# Patient Record
Sex: Female | Born: 1995 | Hispanic: Yes | Marital: Single | Smoking: Never smoker
Health system: Southern US, Community
[De-identification: ages and names within clinical notes are randomized; demographics above are authoritative.]

## PROBLEM LIST (undated history)

## (undated) ENCOUNTER — Inpatient Hospital Stay: Payer: Self-pay

## (undated) DIAGNOSIS — Z862 Personal history of diseases of the blood and blood-forming organs and certain disorders involving the immune mechanism: Secondary | ICD-10-CM

## (undated) DIAGNOSIS — D649 Anemia, unspecified: Secondary | ICD-10-CM

## (undated) HISTORY — DX: Personal history of diseases of the blood and blood-forming organs and certain disorders involving the immune mechanism: Z86.2

---

## 2015-06-20 NOTE — L&D Delivery Note (Signed)
Delivery Note At  a viable and healthy female "Raven Barker" was delivered via  (Presentation:OA ;  ).  APGAR: 8, 9; weight  .   Placenta status: delivered intact with 3 vessel  Cord:  with the following complications: NCx1-reduced on perineum.   Anesthesia:  none Episiotomy:  none Lacerations:  2nd degree Suture Repair: 3.0 vicryl rapide Est. Blood Loss (mL):  300  Mom to postpartum.  Baby to Couplet care / Skin to Skin.  [name], CNM 01/29/2016, 4:36 PM

## 2015-07-08 ENCOUNTER — Ambulatory Visit (INDEPENDENT_AMBULATORY_CARE_PROVIDER_SITE_OTHER): Payer: Self-pay | Admitting: Obstetrics and Gynecology

## 2015-07-08 ENCOUNTER — Ambulatory Visit (INDEPENDENT_AMBULATORY_CARE_PROVIDER_SITE_OTHER): Payer: Self-pay

## 2015-07-08 VITALS — BP 135/76 | HR 116 | Ht 66.0 in | Wt 140.1 lb

## 2015-07-08 DIAGNOSIS — Z36 Encounter for antenatal screening of mother: Secondary | ICD-10-CM

## 2015-07-08 DIAGNOSIS — Z3682 Encounter for antenatal screening for nuchal translucency: Secondary | ICD-10-CM

## 2015-07-08 DIAGNOSIS — Z113 Encounter for screening for infections with a predominantly sexual mode of transmission: Secondary | ICD-10-CM

## 2015-07-08 DIAGNOSIS — Z369 Encounter for antenatal screening, unspecified: Secondary | ICD-10-CM

## 2015-07-08 DIAGNOSIS — Z3490 Encounter for supervision of normal pregnancy, unspecified, unspecified trimester: Secondary | ICD-10-CM

## 2015-07-08 DIAGNOSIS — Z331 Pregnant state, incidental: Secondary | ICD-10-CM

## 2015-07-08 DIAGNOSIS — Z3687 Encounter for antenatal screening for uncertain dates: Secondary | ICD-10-CM

## 2015-07-08 DIAGNOSIS — O283 Abnormal ultrasonic finding on antenatal screening of mother: Secondary | ICD-10-CM

## 2015-07-08 DIAGNOSIS — Z1389 Encounter for screening for other disorder: Secondary | ICD-10-CM

## 2015-07-08 NOTE — Progress Notes (Signed)
   Raven Barker presents for NOB nurse interview visit. G-1.  P-0. Pregnancy confirmation done at [facility] on 06/24/2015. LMP: 04/22/2015. Pregnancy education material explained and given. No cats in the home. NOB labs ordered.  HIV labs and Drug screen were explained optional and she could opt out of tests but did not decline. Drug screen ordered. No n/v at present. Not a problem at present. PNV encouraged. NT to discuss with provider.  Ultrasound today for viability and dating.  Pt. To follow up with provider in 2 weeks for NOB physical and NT ultrasound.  Fob (maternal aunt) had child (cousin) with Down's Syndrome and at the time of pregnancy she was AMA.  All questions answered.  ZIKA EXPOSURE SCREEN:  The patient has not traveled to a [place] Virus endemic area within the past 6 months, nor has she had unprotected sex with a partner who has travelled to a [place] endemic region within the past 6 months. The patient has been advised to notify us if these factors change any time during this current pregnancy, so adequate testing and monitoring can be initiated.

## 2015-07-08 NOTE — Patient Instructions (Signed)

## 2015-07-09 LAB — PAIN MGT SCRN (14 DRUGS), UR
Amphetamine Screen, Ur: NEGATIVE ng/mL
BARBITURATE SCRN UR: NEGATIVE ng/mL
BENZODIAZEPINE SCREEN, URINE: NEGATIVE ng/mL
Buprenorphine, Urine: NEGATIVE ng/mL
CREATININE(CRT), U: 243.7 mg/dL (ref 20.0–300.0)
Cannabinoids Ur Ql Scn: NEGATIVE ng/mL
Cocaine(Metab.)Screen, Urine: NEGATIVE ng/mL
Fentanyl, Urine: NEGATIVE pg/mL
MEPERIDINE SCREEN, URINE: NEGATIVE ng/mL
METHADONE SCREEN, URINE: NEGATIVE ng/mL
OPIATE SCRN UR: NEGATIVE ng/mL
Oxycodone+Oxymorphone Ur Ql Scn: NEGATIVE ng/mL
PCP Scrn, Ur: NEGATIVE ng/mL
Ph of Urine: 6.3 (ref 4.5–8.9)
Propoxyphene, Screen: NEGATIVE ng/mL
Tramadol Ur Ql Scn: NEGATIVE ng/mL

## 2015-07-09 LAB — URINALYSIS, ROUTINE W REFLEX MICROSCOPIC
BILIRUBIN UA: NEGATIVE
GLUCOSE, UA: NEGATIVE
KETONES UA: NEGATIVE
LEUKOCYTES UA: NEGATIVE
Nitrite, UA: NEGATIVE
PROTEIN UA: NEGATIVE
RBC, UA: NEGATIVE
SPEC GRAV UA: 1.014 (ref 1.005–1.030)
Urobilinogen, Ur: 0.2 mg/dL (ref 0.2–1.0)
pH, UA: 7.5 (ref 5.0–7.5)

## 2015-07-09 LAB — NICOTINE SCREEN, URINE: COTININE UR QL SCN: NEGATIVE ng/mL

## 2015-07-09 LAB — GC/CHLAMYDIA PROBE AMP
Chlamydia trachomatis, NAA: NEGATIVE
NEISSERIA GONORRHOEAE BY PCR: NEGATIVE

## 2015-07-10 LAB — CULTURE, OB URINE

## 2015-07-10 LAB — URINE CULTURE, OB REFLEX

## 2015-07-10 LAB — SPECIMEN STATUS REPORT

## 2015-07-12 ENCOUNTER — Other Ambulatory Visit: Payer: Self-pay | Admitting: Obstetrics and Gynecology

## 2015-07-12 DIAGNOSIS — Z283 Underimmunization status: Secondary | ICD-10-CM

## 2015-07-12 DIAGNOSIS — Z2839 Other underimmunization status: Secondary | ICD-10-CM | POA: Insufficient documentation

## 2015-07-12 DIAGNOSIS — O9989 Other specified diseases and conditions complicating pregnancy, childbirth and the puerperium: Principal | ICD-10-CM

## 2015-07-12 LAB — RUBELLA ANTIBODY, IGM

## 2015-07-12 LAB — HEPATITIS B SURFACE ANTIGEN: HEP B S AG: NEGATIVE

## 2015-07-12 LAB — ABO

## 2015-07-12 LAB — HIV ANTIBODY (ROUTINE TESTING W REFLEX): HIV SCREEN 4TH GENERATION: NONREACTIVE

## 2015-07-12 LAB — ANTIBODY SCREEN: ANTIBODY SCREEN: NEGATIVE

## 2015-07-12 LAB — RH TYPE

## 2015-07-12 LAB — CBC WITH DIFFERENTIAL/PLATELET

## 2015-07-12 LAB — RPR: RPR: NONREACTIVE

## 2015-07-12 LAB — VARICELLA ZOSTER ANTIBODY, IGM: VARICELLA IGM: 1.37 {index} — AB (ref 0.00–0.90)

## 2015-07-21 ENCOUNTER — Other Ambulatory Visit: Payer: Self-pay | Admitting: Obstetrics and Gynecology

## 2015-07-21 ENCOUNTER — Encounter: Payer: Self-pay | Admitting: Obstetrics and Gynecology

## 2015-07-21 ENCOUNTER — Ambulatory Visit (INDEPENDENT_AMBULATORY_CARE_PROVIDER_SITE_OTHER): Payer: Medicaid Other

## 2015-07-21 ENCOUNTER — Ambulatory Visit (INDEPENDENT_AMBULATORY_CARE_PROVIDER_SITE_OTHER): Payer: Medicaid Other | Admitting: Obstetrics and Gynecology

## 2015-07-21 VITALS — BP 91/50 | HR 88 | Wt 138.1 lb

## 2015-07-21 DIAGNOSIS — O09899 Supervision of other high risk pregnancies, unspecified trimester: Secondary | ICD-10-CM

## 2015-07-21 DIAGNOSIS — O9989 Other specified diseases and conditions complicating pregnancy, childbirth and the puerperium: Secondary | ICD-10-CM

## 2015-07-21 DIAGNOSIS — Z283 Underimmunization status: Secondary | ICD-10-CM

## 2015-07-21 DIAGNOSIS — Z36 Encounter for antenatal screening of mother: Secondary | ICD-10-CM | POA: Diagnosis not present

## 2015-07-21 DIAGNOSIS — O283 Abnormal ultrasonic finding on antenatal screening of mother: Secondary | ICD-10-CM | POA: Diagnosis not present

## 2015-07-21 DIAGNOSIS — Z331 Pregnant state, incidental: Secondary | ICD-10-CM

## 2015-07-21 LAB — POCT URINALYSIS DIPSTICK
Blood, UA: NEGATIVE
Glucose, UA: NEGATIVE
KETONES UA: 5
Nitrite, UA: POSITIVE
SPEC GRAV UA: 1.02
Urobilinogen, UA: 0.2
pH, UA: 6.5

## 2015-07-21 NOTE — Progress Notes (Signed)
NOB- pt is very nauseated-samples given diclegis

## 2015-07-21 NOTE — Patient Instructions (Signed)

## 2015-07-21 NOTE — Progress Notes (Signed)
NEW OB HISTORY AND PHYSICAL  SUBJECTIVE:       Raven Barker is a 20 y.o. G1P0 female, Patient's last menstrual period was 04/22/2015 (approximate)., Estimated Date of Delivery: 01/27/16, [redacted]w[redacted]d, presents today for establishment of Prenatal Care. She has no unusual complaints and complains of backache      Gynecologic History Patient's last menstrual period was 04/22/2015 (approximate). Unknown Contraception: none  Obstetric History OB History  Gravida Para Term Preterm AB SAB TAB Ectopic Multiple Living  1             # Outcome Date GA Lbr Len/2nd Weight Sex Delivery Anes PTL Lv  1 Current               Past Medical History  Diagnosis Date  . History of anemia     History reviewed. No pertinent past surgical history.  Current Outpatient Prescriptions on File Prior to Visit  Medication Sig Dispense Refill  . Prenatal Vit-Fe Fumarate-FA (PRENATAL MULTIVITAMIN) TABS tablet Take 1 tablet by mouth daily at 12 noon.     No current facility-administered medications on file prior to visit.    No Known Allergies  Social History   Social History  . Marital Status: Single    Spouse Name: N/A  . Number of Children: N/A  . Years of Education: N/A   Occupational History  . Not on file.   Social History Main Topics  . Smoking status: Never Smoker   . Smokeless tobacco: Never Used  . Alcohol Use: No  . Drug Use: No  . Sexual Activity:    Partners: Male   Other Topics Concern  . Not on file   Social History Narrative    Family History  Problem Relation Age of Onset  . Diabetes Paternal Grandmother     The following portions of the patient's history were reviewed and updated as appropriate: allergies, current medications, past OB history, past medical history, past surgical history, past family history, past social history, and problem list.    OBJECTIVE: Initial Physical Exam (New OB)  GENERAL APPEARANCE: alert, well appearing, in no apparent distress,  oriented to person, place and time HEAD: normocephalic, atraumatic MOUTH: mucous membranes moist, pharynx normal without lesions THYROID: no thyromegaly or masses present BREASTS: no masses noted, no significant tenderness, no palpable axillary nodes, no skin changes LUNGS: clear to auscultation, no wheezes, rales or rhonchi, symmetric air entry HEART: regular rate and rhythm, no murmurs ABDOMEN: soft, nontender, nondistended, no abnormal masses, no epigastric pain, fundus not palpable and FHT present EXTREMITIES: no redness or tenderness in the calves or thighs SKIN: normal coloration and turgor, no rashes LYMPH NODES: no adenopathy palpable NEUROLOGIC: alert, oriented, normal speech, no focal findings or movement disorder noted  PELVIC EXAM not indicated  ASSESSMENT: Normal pregnancy  PLAN: Prenatal care See ordersIndications:First Trimester Screen - NT Findings:  Singleton intrauterine pregnancy is visualized with a CRL consistent with 12 weeks 4 days gestation, giving an (U/S) EDD of 01/29/16. The (U/S) EDD is consistent with the clinically established (LMP) EDD of 01/27/16.  FHR: 158 bpm. CRL measurement: 60.4 mm NT measurement: 1.5 mm. Yolk sac and and early anatomy is normal.  Right Ovary is not visualized. Left Ovary measures 3.0 x 2.5 x 2.1 cm. It appears normal in appearance. There is no evidence of a corpus luteal cyst. Survey of the adnexa demonstrates no adnexal masses. There is no free peritoneal fluid in the cul de sac.  Impression: 1. 12 week  4 day Viable Singleton Intrauterine pregnancy by U/S. 2. (U/S) EDD is consistent with Clinically established (LMP) EDD of 01/27/16. 3. NT Screen successfully completed.

## 2015-07-22 LAB — SPECIMEN STATUS

## 2015-07-23 LAB — FIRST TRIMESTER SCREEN W/NT
CRL: 60.4 mm
DIA MOM: 0.94
DIA Value: 241.3 pg/mL
Gest Age-Collect: 12.4 weeks
Maternal Age At EDD: 21.5 years
Nuchal Translucency MoM: 1.26
Nuchal Translucency: 1.5 mm
Number of Fetuses: 1
PAPP-A MOM: 0.89
PAPP-A VALUE: 931.7 ng/mL
PDF: 0
Test Results:: NEGATIVE
Weight: 138 [lb_av]
hCG MoM: 0.77
hCG Value: 76.9 IU/mL

## 2015-07-23 LAB — CBC
Hematocrit: 36.9 % (ref 34.0–46.6)
Hemoglobin: 12.6 g/dL (ref 11.1–15.9)
MCH: 30.1 pg (ref 26.6–33.0)
MCHC: 34.1 g/dL (ref 31.5–35.7)
MCV: 88 fL (ref 79–97)
Platelets: 292 10*3/uL (ref 150–379)
RBC: 4.19 x10E6/uL (ref 3.77–5.28)
RDW: 13.4 % (ref 12.3–15.4)
WBC: 7 10*3/uL (ref 3.4–10.8)

## 2015-07-23 LAB — SPECIMEN STATUS REPORT

## 2015-07-23 LAB — ABO/RH: Rh Factor: POSITIVE

## 2015-08-20 ENCOUNTER — Encounter: Payer: Medicaid Other | Admitting: Obstetrics and Gynecology

## 2015-09-07 ENCOUNTER — Ambulatory Visit (INDEPENDENT_AMBULATORY_CARE_PROVIDER_SITE_OTHER): Payer: Medicaid Other | Admitting: Obstetrics and Gynecology

## 2015-09-07 ENCOUNTER — Encounter: Payer: Self-pay | Admitting: Obstetrics and Gynecology

## 2015-09-07 VITALS — BP 112/62 | HR 99 | Wt 141.0 lb

## 2015-09-07 DIAGNOSIS — Z331 Pregnant state, incidental: Secondary | ICD-10-CM

## 2015-09-07 LAB — POCT URINALYSIS DIPSTICK
Bilirubin, UA: NEGATIVE
Glucose, UA: NEGATIVE
Ketones, UA: NEGATIVE
Leukocytes, UA: NEGATIVE
NITRITE UA: NEGATIVE
PROTEIN UA: NEGATIVE
RBC UA: NEGATIVE
SPEC GRAV UA: 1.01
UROBILINOGEN UA: 0.2
pH, UA: 7

## 2015-09-07 NOTE — Progress Notes (Signed)
ROB-pt denies any complaints 

## 2015-09-15 ENCOUNTER — Ambulatory Visit (INDEPENDENT_AMBULATORY_CARE_PROVIDER_SITE_OTHER): Payer: Medicaid Other

## 2015-09-15 DIAGNOSIS — Z331 Pregnant state, incidental: Secondary | ICD-10-CM

## 2015-09-24 ENCOUNTER — Other Ambulatory Visit: Payer: Self-pay | Admitting: Obstetrics and Gynecology

## 2015-09-24 DIAGNOSIS — Z0489 Encounter for examination and observation for other specified reasons: Secondary | ICD-10-CM

## 2015-09-24 DIAGNOSIS — IMO0002 Reserved for concepts with insufficient information to code with codable children: Secondary | ICD-10-CM

## 2015-09-30 ENCOUNTER — Ambulatory Visit (INDEPENDENT_AMBULATORY_CARE_PROVIDER_SITE_OTHER): Payer: Medicaid Other

## 2015-09-30 DIAGNOSIS — Z36 Encounter for antenatal screening of mother: Secondary | ICD-10-CM | POA: Diagnosis not present

## 2015-09-30 DIAGNOSIS — IMO0002 Reserved for concepts with insufficient information to code with codable children: Secondary | ICD-10-CM

## 2015-09-30 DIAGNOSIS — Z0489 Encounter for examination and observation for other specified reasons: Secondary | ICD-10-CM

## 2015-10-08 ENCOUNTER — Ambulatory Visit (INDEPENDENT_AMBULATORY_CARE_PROVIDER_SITE_OTHER): Payer: Medicaid Other | Admitting: Obstetrics and Gynecology

## 2015-10-08 ENCOUNTER — Encounter: Payer: Self-pay | Admitting: Obstetrics and Gynecology

## 2015-10-08 VITALS — BP 103/67 | HR 106 | Wt 152.3 lb

## 2015-10-08 DIAGNOSIS — Z331 Pregnant state, incidental: Secondary | ICD-10-CM

## 2015-10-08 LAB — POCT URINALYSIS DIPSTICK
Bilirubin, UA: NEGATIVE
GLUCOSE UA: NEGATIVE
KETONES UA: NEGATIVE
Leukocytes, UA: NEGATIVE
Nitrite, UA: NEGATIVE
Protein, UA: NEGATIVE
RBC UA: NEGATIVE
SPEC GRAV UA: 1.015
UROBILINOGEN UA: 0.2
pH, UA: 7

## 2015-10-08 NOTE — Progress Notes (Signed)
ROB- doing well w/o complaint; reviewed u/s findings from last week and counseled on LLP (2.2cm from os). Will rescan in 4 weeks at glucola visit. Gave schedule for classes and encouraged enrollment. Plans breast feeding.

## 2015-10-08 NOTE — Progress Notes (Signed)
ROB-pt denies any complaints 

## 2015-11-01 ENCOUNTER — Other Ambulatory Visit: Payer: Self-pay | Admitting: *Deleted

## 2015-11-01 DIAGNOSIS — Z131 Encounter for screening for diabetes mellitus: Secondary | ICD-10-CM

## 2015-11-01 DIAGNOSIS — Z3493 Encounter for supervision of normal pregnancy, unspecified, third trimester: Secondary | ICD-10-CM

## 2015-11-05 ENCOUNTER — Ambulatory Visit (INDEPENDENT_AMBULATORY_CARE_PROVIDER_SITE_OTHER): Payer: Medicaid Other | Admitting: Obstetrics and Gynecology

## 2015-11-05 ENCOUNTER — Other Ambulatory Visit: Payer: Medicaid Other

## 2015-11-05 ENCOUNTER — Ambulatory Visit (INDEPENDENT_AMBULATORY_CARE_PROVIDER_SITE_OTHER): Payer: Medicaid Other

## 2015-11-05 ENCOUNTER — Other Ambulatory Visit: Payer: Self-pay | Admitting: Obstetrics and Gynecology

## 2015-11-05 ENCOUNTER — Encounter: Payer: Self-pay | Admitting: Obstetrics and Gynecology

## 2015-11-05 VITALS — BP 115/63 | HR 85 | Wt 160.0 lb

## 2015-11-05 DIAGNOSIS — Z3403 Encounter for supervision of normal first pregnancy, third trimester: Secondary | ICD-10-CM

## 2015-11-05 DIAGNOSIS — Z3493 Encounter for supervision of normal pregnancy, unspecified, third trimester: Secondary | ICD-10-CM

## 2015-11-05 DIAGNOSIS — Z331 Pregnant state, incidental: Secondary | ICD-10-CM

## 2015-11-05 DIAGNOSIS — Z23 Encounter for immunization: Secondary | ICD-10-CM

## 2015-11-05 LAB — POCT URINALYSIS DIPSTICK
BILIRUBIN UA: NEGATIVE
Glucose, UA: NEGATIVE
KETONES UA: NEGATIVE
Leukocytes, UA: NEGATIVE
Nitrite, UA: NEGATIVE
PH UA: 6.5
Protein, UA: NEGATIVE
RBC UA: NEGATIVE
Spec Grav, UA: 1.015
Urobilinogen, UA: 0.2

## 2015-11-05 MED ORDER — TETANUS-DIPHTH-ACELL PERTUSSIS 5-2.5-18.5 LF-MCG/0.5 IM SUSP: 0.5000 mL | Freq: Once | INTRAMUSCULAR | Status: DC

## 2015-11-05 NOTE — Progress Notes (Signed)
ROB- glucola done, blood consent signed, tdap given Pt denies any new complaints

## 2015-11-05 NOTE — Progress Notes (Signed)
  Indications:placenta location Findings:  Raven JimSingleton intrauterine pregnancy is visualized with FHR at 145 BPM.  Fetal presentation is Vertex. Placenta: posterior, grade1, 3cm from internal os. AFI: subjectively adequate.  Impression: 1. Placenta is 3 cm from internal os  ROB- denies concerns, has completed classes, discussed cord blood donation

## 2015-11-06 LAB — GLUCOSE, 1 HOUR GESTATIONAL: Gestational Diabetes Screen: 103 mg/dL (ref 65–139)

## 2015-11-06 LAB — HEMOGLOBIN: HEMOGLOBIN: 10.5 g/dL — AB (ref 11.1–15.9)

## 2015-11-06 LAB — HEMATOCRIT: HEMATOCRIT: 31.1 % — AB (ref 34.0–46.6)

## 2015-11-23 ENCOUNTER — Encounter: Payer: Self-pay | Admitting: Obstetrics and Gynecology

## 2015-11-23 ENCOUNTER — Ambulatory Visit (INDEPENDENT_AMBULATORY_CARE_PROVIDER_SITE_OTHER): Payer: Medicaid Other | Admitting: Obstetrics and Gynecology

## 2015-11-23 VITALS — BP 101/63 | HR 105 | Wt 166.2 lb

## 2015-11-23 DIAGNOSIS — Z36 Encounter for antenatal screening of mother: Secondary | ICD-10-CM

## 2015-11-23 DIAGNOSIS — Z1389 Encounter for screening for other disorder: Secondary | ICD-10-CM

## 2015-11-23 DIAGNOSIS — Z369 Encounter for antenatal screening, unspecified: Secondary | ICD-10-CM

## 2015-11-23 DIAGNOSIS — N390 Urinary tract infection, site not specified: Secondary | ICD-10-CM

## 2015-11-23 LAB — POCT URINALYSIS DIPSTICK
BILIRUBIN UA: NEGATIVE
Glucose, UA: NEGATIVE
KETONES UA: NEGATIVE
Nitrite, UA: POSITIVE
PH UA: 6
PROTEIN UA: NEGATIVE
RBC UA: NEGATIVE
SPEC GRAV UA: 1.02
Urobilinogen, UA: NEGATIVE

## 2015-11-23 MED ORDER — NITROFURANTOIN MONOHYD MACRO 100 MG PO CAPS: 100.0000 mg | ORAL_CAPSULE | Freq: Two times a day (BID) | ORAL | Status: DC

## 2015-11-23 NOTE — Progress Notes (Signed)
Pt's urinalysis positive for nitrates and trace protein. Urine sent for culture and rx for Macrobid 100mg . 2xd, x5 days (per [facility]) sent to pharmacy and pt aware.

## 2015-11-23 NOTE — Addendum Note (Signed)
Addended by: Jackquline DenmarkIDGEWAY, Sapphira Harjo W on: 11/23/2015 03:20 PM   Modules accepted: Orders

## 2015-11-23 NOTE — Progress Notes (Signed)
ROB- doing well, plans breastfeeding, not planning to circumcise; unsure on BC- used OCPs in past.

## 2015-11-29 ENCOUNTER — Other Ambulatory Visit: Payer: Self-pay | Admitting: Obstetrics and Gynecology

## 2015-11-29 LAB — CULTURE, OB URINE

## 2015-11-29 LAB — URINE CULTURE, OB REFLEX

## 2015-12-08 ENCOUNTER — Encounter: Payer: Medicaid Other | Admitting: Obstetrics and Gynecology

## 2015-12-09 ENCOUNTER — Ambulatory Visit (INDEPENDENT_AMBULATORY_CARE_PROVIDER_SITE_OTHER): Payer: Medicaid Other | Admitting: Obstetrics and Gynecology

## 2015-12-09 VITALS — BP 89/84 | HR 99 | Wt 170.8 lb

## 2015-12-09 DIAGNOSIS — O2343 Unspecified infection of urinary tract in pregnancy, third trimester: Secondary | ICD-10-CM

## 2015-12-09 DIAGNOSIS — Z3403 Encounter for supervision of normal first pregnancy, third trimester: Secondary | ICD-10-CM | POA: Insufficient documentation

## 2015-12-09 LAB — POCT URINALYSIS DIPSTICK
Bilirubin, UA: NEGATIVE
Glucose, UA: NEGATIVE
KETONES UA: NEGATIVE
NITRITE UA: NEGATIVE
PH UA: 6.5
PROTEIN UA: NEGATIVE
Spec Grav, UA: 1.015
Urobilinogen, UA: 1

## 2015-12-09 NOTE — Progress Notes (Signed)
ROB: Denies complaints. Doing well. TOC UCx for prior UTI done. RTC in 2 weeks.

## 2015-12-11 LAB — URINE CULTURE

## 2015-12-22 ENCOUNTER — Encounter: Payer: Self-pay | Admitting: Obstetrics and Gynecology

## 2015-12-22 ENCOUNTER — Ambulatory Visit (INDEPENDENT_AMBULATORY_CARE_PROVIDER_SITE_OTHER): Payer: Medicaid Other | Admitting: Obstetrics and Gynecology

## 2015-12-22 VITALS — BP 107/69 | HR 96 | Wt 171.7 lb

## 2015-12-22 DIAGNOSIS — Z3493 Encounter for supervision of normal pregnancy, unspecified, third trimester: Secondary | ICD-10-CM

## 2015-12-22 LAB — POCT URINALYSIS DIPSTICK
BILIRUBIN UA: NEGATIVE
Blood, UA: NEGATIVE
Glucose, UA: NEGATIVE
KETONES UA: NEGATIVE
LEUKOCYTES UA: NEGATIVE
Nitrite, UA: NEGATIVE
Protein, UA: NEGATIVE
Spec Grav, UA: 1.015
Urobilinogen, UA: 0.2
pH, UA: 6.5

## 2015-12-22 NOTE — Progress Notes (Signed)
ROB- doing well, discussed labor support & pain management,

## 2015-12-22 NOTE — Progress Notes (Signed)
ROB- pt is doing well denies any complaints 

## 2016-01-04 ENCOUNTER — Ambulatory Visit (INDEPENDENT_AMBULATORY_CARE_PROVIDER_SITE_OTHER): Payer: Medicaid Other | Admitting: Obstetrics and Gynecology

## 2016-01-04 ENCOUNTER — Encounter: Payer: Self-pay | Admitting: Obstetrics and Gynecology

## 2016-01-04 VITALS — BP 118/61 | HR 100 | Wt 176.8 lb

## 2016-01-04 DIAGNOSIS — Z3685 Encounter for antenatal screening for Streptococcus B: Secondary | ICD-10-CM

## 2016-01-04 DIAGNOSIS — Z3493 Encounter for supervision of normal pregnancy, unspecified, third trimester: Secondary | ICD-10-CM

## 2016-01-04 DIAGNOSIS — Z36 Encounter for antenatal screening of mother: Secondary | ICD-10-CM

## 2016-01-04 DIAGNOSIS — Z113 Encounter for screening for infections with a predominantly sexual mode of transmission: Secondary | ICD-10-CM

## 2016-01-04 LAB — POCT URINALYSIS DIPSTICK
Bilirubin, UA: NEGATIVE
Glucose, UA: NEGATIVE
KETONES UA: NEGATIVE
Leukocytes, UA: NEGATIVE
Nitrite, UA: NEGATIVE
PH UA: 7
RBC UA: NEGATIVE
SPEC GRAV UA: 1.015
Urobilinogen, UA: 0.2

## 2016-01-04 NOTE — Progress Notes (Signed)
ROB- discussed BHC and labor, cultures obtained.

## 2016-01-04 NOTE — Progress Notes (Signed)
ROB- cultures obtained, pt is having pelvic pressure, some contractions

## 2016-01-06 ENCOUNTER — Other Ambulatory Visit: Payer: Self-pay | Admitting: Obstetrics and Gynecology

## 2016-01-06 DIAGNOSIS — O99820 Streptococcus B carrier state complicating pregnancy: Secondary | ICD-10-CM | POA: Insufficient documentation

## 2016-01-06 LAB — GC/CHLAMYDIA PROBE AMP
CHLAMYDIA, DNA PROBE: NEGATIVE
NEISSERIA GONORRHOEAE BY PCR: NEGATIVE

## 2016-01-06 LAB — STREP GP B NAA: STREP GROUP B AG: POSITIVE — AB

## 2016-01-07 ENCOUNTER — Ambulatory Visit (INDEPENDENT_AMBULATORY_CARE_PROVIDER_SITE_OTHER): Payer: Medicaid Other | Admitting: Obstetrics and Gynecology

## 2016-01-07 ENCOUNTER — Telehealth: Payer: Self-pay | Admitting: Obstetrics and Gynecology

## 2016-01-07 ENCOUNTER — Encounter: Payer: Self-pay | Admitting: Obstetrics and Gynecology

## 2016-01-07 VITALS — BP 110/64 | HR 100 | Wt 175.3 lb

## 2016-01-07 DIAGNOSIS — N898 Other specified noninflammatory disorders of vagina: Secondary | ICD-10-CM | POA: Diagnosis not present

## 2016-01-07 NOTE — Telephone Encounter (Signed)
Pt is coming in 01/07/16

## 2016-01-07 NOTE — Progress Notes (Signed)
Work-in OB- reports small gushes of clear mucus this am and mid-morning, with none since then- pad dry, and perineum with clear mucus noted. NTZ and Fern negative, mucus only noted on sterile speculum exam. Reassured and informed to let us know if things change.

## 2016-01-07 NOTE — Telephone Encounter (Signed)
37 wks preganant, not in pain, fluid leaking and her underwear keep getting wet

## 2016-01-07 NOTE — Progress Notes (Signed)
OB WORK IN- pt thinks she may be leaking fluid since last night

## 2016-01-11 ENCOUNTER — Ambulatory Visit (INDEPENDENT_AMBULATORY_CARE_PROVIDER_SITE_OTHER): Payer: Medicaid Other | Admitting: Obstetrics and Gynecology

## 2016-01-11 VITALS — BP 109/64 | HR 104 | Wt 176.7 lb

## 2016-01-11 DIAGNOSIS — Z3493 Encounter for supervision of normal pregnancy, unspecified, third trimester: Secondary | ICD-10-CM

## 2016-01-11 LAB — POCT URINALYSIS DIPSTICK
Bilirubin, UA: NEGATIVE
Glucose, UA: NEGATIVE
KETONES UA: NEGATIVE
Nitrite, UA: NEGATIVE
PH UA: 7
RBC UA: NEGATIVE
SPEC GRAV UA: 1.01
UROBILINOGEN UA: 0.2

## 2016-01-11 NOTE — Progress Notes (Signed)
ROB- pt is c/o ":bumps on her abdomen x 2 days ago- they itch"

## 2016-01-11 NOTE — Patient Instructions (Signed)
Group B streptococcus (GBS) is a type of bacteria often found in healthy women. GBS is not the same as the bacteria that causes strep throat. You may have GBS in your vagina, rectum, or bladder. GBS does not spread through sexual contact, but it can be passed to a baby during childbirth. This can be dangerous for your baby. It is not dangerous to you and usually does not cause any symptoms. Your health care provider may test you for GBS when your pregnancy is between 35 and 37 weeks. GBS is dangerous only during birth, so there is no need to test for it earlier. It is possible to have GBS during pregnancy and never pass it to your baby. If your test results are positive for GBS, your health care provider may recommend giving you antibiotic medicine during delivery to make sure your baby stays healthy. RISK FACTORS You are more likely to pass GBS to your baby if:   Your water breaks (ruptured membrane) or you go into labor before 37 weeks.  Your water breaks 18 hours before you deliver.  You passed GBS during a previous pregnancy.  You have a urinary tract infection caused by GBS any time during pregnancy.  You have a fever during labor. SYMPTOMS Most women who have GBS do not have any symptoms. If you have a urinary tract infection caused by GBS, you might have frequent or painful urination and fever. Babies who get GBS usually show symptoms within 7 days of birth. Symptoms may include:   Breathing problems.  Heart and blood pressure problems.  Digestive and kidney problems. DIAGNOSIS Routine screening for GBS is recommended for all pregnant women. A health care provider takes a sample of the fluid in your vagina and rectum with a swab. It is then sent to a lab to be checked for GBS. A sample of your urine may also be checked for the bacteria.  TREATMENT If you test positive for GBS, you may need treatment with an antibiotic medicine during labor. As soon as you go into labor, or as soon as  your membranes rupture, you will get the antibiotic medicine through an IV access. You will continue to get the medicine until after you give birth. You do not need antibiotic medicine if you are having a cesarean delivery.If your baby shows signs or symptoms of GBS after birth, your baby can also be treated with an antibiotic medicine. HOME CARE INSTRUCTIONS   Take all antibiotic medicine as prescribed by your health care provider. Only take medicine as directed.   Continue with prenatal visits and care.   Keep all follow-up appointments.  SEEK MEDICAL CARE IF:   You have pain when you urinate.   You have to urinate frequently.   You have a fever.  SEEK IMMEDIATE MEDICAL CARE IF:   Your membranes rupture.  You go into labor.   This information is not intended to replace advice given to you by your health care provider. Make sure you discuss any questions you have with your health care provider.   Document Released: 09/12/2007 Document Revised: 06/10/2013 Document Reviewed: 03/28/2013 Elsevier Interactive Patient Education 2016 Elsevier Inc.  

## 2016-01-11 NOTE — Progress Notes (Signed)
ROB- reviewed GBS+ and treatment in labor.

## 2016-01-14 ENCOUNTER — Encounter: Payer: Self-pay | Admitting: Emergency Medicine

## 2016-01-14 ENCOUNTER — Emergency Department
Admission: EM | Admit: 2016-01-14 | Discharge: 2016-01-14 | Disposition: A | Discharge status: Home / Self Care | Payer: Medicaid Other | Attending: Emergency Medicine | Admitting: Emergency Medicine

## 2016-01-14 DIAGNOSIS — O2686 Pruritic urticarial papules and plaques of pregnancy (PUPPP): Secondary | ICD-10-CM | POA: Diagnosis not present

## 2016-01-14 DIAGNOSIS — L209 Atopic dermatitis, unspecified: Secondary | ICD-10-CM

## 2016-01-14 DIAGNOSIS — R21 Rash and other nonspecific skin eruption: Secondary | ICD-10-CM | POA: Diagnosis present

## 2016-01-14 DIAGNOSIS — Z3A38 38 weeks gestation of pregnancy: Secondary | ICD-10-CM | POA: Diagnosis not present

## 2016-01-14 MED ORDER — DIPHENHYDRAMINE HCL 25 MG PO CAPS
50.0000 mg | ORAL_CAPSULE | Freq: Once | ORAL | Status: AC
  Administered 2016-01-14: 50 mg via ORAL
  Filled 2016-01-14: qty 2

## 2016-01-14 NOTE — ED Provider Notes (Signed)
[facility] Emergency Department Provider Note  ____________________________________________   First MD Initiated Contact with Patient 01/14/16 [phone]     (approximate)  I have reviewed the triage vital signs and the nursing notes.   HISTORY  Chief Complaint Rash    HPI Raven Barker is a 20 y.o. female possibly [redacted] weeks pregnant presents with 2 day history of bilateral lower extremity and abdominal rash for which she was seen by the OB and diagnosed with "sweat bumps". Patient states she was advised to use diaper rash cream on the areas however this has not improved her symptoms. Patient denies any fever   Past Medical History:  Diagnosis Date  . History of anemia     Patient Active Problem List   Diagnosis Date Noted  . GBS (group B Streptococcus carrier), +RV culture, currently pregnant 01/06/2016  . Supervision of normal first pregnancy in third trimester 12/09/2015  . Rubella non-immune status, antepartum 07/12/2015    History reviewed. No pertinent surgical history.  Prior to Admission medications   Medication Sig Start Date End Date Taking? Authorizing Provider  ferrous sulfate 325 (65 FE) MG tablet Take 325 mg by mouth daily with breakfast.    Historical Provider, MD  Prenatal Vit-Fe Fumarate-FA (PRENATAL MULTIVITAMIN) TABS tablet Take 1 tablet by mouth daily at 12 noon.    Historical Provider, MD    Allergies No known drug allergies  Family History  Problem Relation Age of Onset  . Diabetes Paternal Grandmother     Social History Social History  Substance Use Topics  . Smoking status: Never Smoker  . Smokeless tobacco: Never Used  . Alcohol use No    Review of Systems Constitutional: No fever/chills Eyes: No visual changes. ENT: No sore throat. Cardiovascular: Denies chest pain. Respiratory: Denies shortness of breath. Gastrointestinal: No abdominal pain.  No nausea, no vomiting.  No diarrhea.  No  constipation. Genitourinary: Negative for dysuria. Musculoskeletal: Negative for back pain. Skin: Positive for rash. Neurological: Negative for headaches, focal weakness or numbness.  10-point ROS otherwise negative.  ____________________________________________   PHYSICAL EXAM:  VITAL SIGNS: ED Triage Vitals  Enc Vitals Group     BP 01/14/16 0304 134/72     Pulse Rate 01/14/16 0304 (!) 108     Resp 01/14/16 0304 20     Temp 01/14/16 0304 98 F (36.7 C)     Temp Source 01/14/16 0609 Oral     SpO2 01/14/16 0304 100 %     Weight 01/14/16 0304 176 lb (79.8 kg)     Height 01/14/16 0304  (1.651 m)     Head Circumference --      Peak Flow --      Pain Score --      Pain Loc --      Pain Edu? --    Constitutional: Alert and oriented. Well appearing and in no acute distress. Eyes: Conjunctivae are normal. PERRL. EOMI. Head: Atraumatic. Mouth/Throat: Mucous membranes are moist.  Oropharynx non-erythematous. Neck: No stridor.  No meningeal signs.  Cardiovascular: Normal rate, regular rhythm. Good peripheral circulation. Grossly normal heart sounds.   Respiratory: Normal respiratory effort.  No retractions. Lungs CTAB. Gastrointestinal: Soft and nontender. No distention.  Musculoskeletal: No lower extremity tenderness nor edema. No gross deformities of extremities. Neurologic:  Normal speech and language. No gross focal neurologic deficits are appreciated.  Skin:  Distinct papular rash noted bilateral inner thighs, bilateral flank and anterior abdominal wall    ____________________________________________  Procedures   ____________________________________________   INITIAL IMPRESSION / ASSESSMENT AND PLAN / ED COURSE  Pertinent labs & imaging results that were available during my care of the patient were reviewed by me and considered in my medical decision making (see chart for details).  Patient given Benadryl 50 mg and advised to take the same at home every 8  hours as needed. Patient's history physical exam consistent with atopic eruption of pregnancy  Clinical Course    ____________________________________________  FINAL CLINICAL IMPRESSION(S) / ED DIAGNOSES  Atopic eruption of pregnancy   MEDICATIONS GIVEN DURING THIS VISIT:  Medications  diphenhydrAMINE (BENADRYL) capsule 50 mg (50 mg Oral Given 01/14/16 0608)     NEW OUTPATIENT MEDICATIONS STARTED DURING THIS VISIT:  New Prescriptions   No medications on file      Note:  This document was prepared using Dragon voice recognition software and may include unintentional dictation errors.    Darci Current, MD 01/14/16 [phone]

## 2016-01-14 NOTE — ED Triage Notes (Signed)
Patient ambulatory to triage with steady gait, without difficulty or distress noted; pt reports itchy rash to legs/abd x 2 days; [redacted]wks pregnant; st was told by OB that she had "sweat bumps" and to use diaper rash cream

## 2016-01-18 ENCOUNTER — Ambulatory Visit (INDEPENDENT_AMBULATORY_CARE_PROVIDER_SITE_OTHER): Payer: Medicaid Other | Admitting: Obstetrics and Gynecology

## 2016-01-18 VITALS — BP 118/72 | HR 106 | Wt 179.5 lb

## 2016-01-18 DIAGNOSIS — Z3493 Encounter for supervision of normal pregnancy, unspecified, third trimester: Secondary | ICD-10-CM

## 2016-01-18 NOTE — Progress Notes (Signed)
ROB- Pt is having pelvic pressure and back pain

## 2016-01-18 NOTE — Progress Notes (Signed)
ROB- labor and postdates discussed           ```````

## 2016-01-20 LAB — URINE CULTURE

## 2016-01-25 ENCOUNTER — Ambulatory Visit (INDEPENDENT_AMBULATORY_CARE_PROVIDER_SITE_OTHER): Payer: Medicaid Other | Admitting: Obstetrics and Gynecology

## 2016-01-25 VITALS — BP 119/77 | HR 84 | Wt 180.6 lb

## 2016-01-25 DIAGNOSIS — Z3493 Encounter for supervision of normal pregnancy, unspecified, third trimester: Secondary | ICD-10-CM

## 2016-01-25 LAB — POCT URINALYSIS DIPSTICK
Glucose, UA: NEGATIVE
Ketones, UA: NEGATIVE
LEUKOCYTES UA: NEGATIVE
NITRITE UA: NEGATIVE
PH UA: 7
Spec Grav, UA: 1.01
UROBILINOGEN UA: 0.2

## 2016-01-25 NOTE — Progress Notes (Signed)
[employer] in [employer]- early labor s/s discussed, bleeding precautions discussed. Will watch for regular contractions,

## 2016-01-25 NOTE — Progress Notes (Signed)
OB WORK IN- pt is having strong menstrual cramps, low back pain, has been seeing some blood when wiping started this am

## 2016-01-26 ENCOUNTER — Inpatient Hospital Stay
Admission: EM | Admit: 2016-01-26 | Discharge: 2016-01-26 | Disposition: A | Discharge status: Home / Self Care | Payer: Medicaid Other | Attending: Obstetrics and Gynecology | Admitting: Obstetrics and Gynecology

## 2016-01-26 ENCOUNTER — Encounter: Payer: Medicaid Other | Admitting: Obstetrics and Gynecology

## 2016-01-26 DIAGNOSIS — Z3A39 39 weeks gestation of pregnancy: Secondary | ICD-10-CM | POA: Insufficient documentation

## 2016-01-26 DIAGNOSIS — O99820 Streptococcus B carrier state complicating pregnancy: Secondary | ICD-10-CM

## 2016-01-26 DIAGNOSIS — O09899 Supervision of other high risk pregnancies, unspecified trimester: Secondary | ICD-10-CM

## 2016-01-26 DIAGNOSIS — O9989 Other specified diseases and conditions complicating pregnancy, childbirth and the puerperium: Secondary | ICD-10-CM

## 2016-01-26 DIAGNOSIS — Z283 Underimmunization status: Secondary | ICD-10-CM

## 2016-01-26 MED ORDER — ZOLPIDEM TARTRATE 5 MG PO TABS
5.0000 mg | ORAL_TABLET | Freq: Once | ORAL | Status: AC
  Administered 2016-01-26: 5 mg via ORAL
  Filled 2016-01-26: qty 1

## 2016-01-26 NOTE — OB Triage Note (Signed)
FHT reactive and reassuring, pt remains calm, still c/o abd pain with contractions, rates 5/10, Ambien 5mg  given. VSS, afebrile, EFM d'ced. Discharge instructions and teaching completed with pt and s/o. What to expect during labor and labor precautions provided to pt. Advised pt to go home, warm shower, rest and drink plenty fluids to stay well hydrated and f/u with primary OB as scheduled on Friday unless admitted in labor.  Encouraged pt to return to hospital with any worsening symptoms. Pt in agreement with plan for discharge.

## 2016-01-26 NOTE — Progress Notes (Signed)
Spoke with Galen ManilaM. Shambley, CNM, explained pt arrival to triage with c/o painful ctx to r/o labor. Pt having irregular contractions, FHT reactive, cat 1, no decels. SVE per RN, cervix 1.5/70/-2, light bloody show and brownish discharge, intact.  Pt agrees to option for Ambien and discharge home per CNM recommendation with instructions for pt to keep her next scheduled appt unless something changes prior to that appt.

## 2016-01-26 NOTE — Progress Notes (Signed)
[facility] in no acute distress, c/o painful regular contractions for the past hour. Confirms +FM, denies recent bloody show, gush or leaking fluid. No n/v/d. Says around 6p contractions were coming every 6 mins, mostly abd tightening, then over the last hour, contractions have become closer and more painful as the night progressed. States she had OB appt yesterday, had light spotting before visit, then heavier vaginal bleeding following exam, bleeding has since resolved, cervix 3cm about 1330. GBS pos.

## 2016-01-27 ENCOUNTER — Other Ambulatory Visit: Payer: Self-pay | Admitting: Obstetrics and Gynecology

## 2016-01-27 DIAGNOSIS — Z3493 Encounter for supervision of normal pregnancy, unspecified, third trimester: Secondary | ICD-10-CM

## 2016-01-28 ENCOUNTER — Ambulatory Visit (INDEPENDENT_AMBULATORY_CARE_PROVIDER_SITE_OTHER): Payer: Medicaid Other | Admitting: Obstetrics and Gynecology

## 2016-01-28 ENCOUNTER — Inpatient Hospital Stay
Admission: EM | Admit: 2016-01-28 | Discharge: 2016-01-31 | DRG: 775 | Disposition: A | Discharge status: Home / Self Care | Payer: Medicaid Other | Attending: Obstetrics and Gynecology | Admitting: Obstetrics and Gynecology

## 2016-01-28 ENCOUNTER — Other Ambulatory Visit: Payer: Medicaid Other

## 2016-01-28 ENCOUNTER — Encounter: Payer: Self-pay | Admitting: *Deleted

## 2016-01-28 ENCOUNTER — Ambulatory Visit: Payer: Medicaid Other | Admitting: Obstetrics and Gynecology

## 2016-01-28 ENCOUNTER — Ambulatory Visit (INDEPENDENT_AMBULATORY_CARE_PROVIDER_SITE_OTHER): Payer: Medicaid Other

## 2016-01-28 VITALS — BP 126/67 | HR 101 | Wt 181.7 lb

## 2016-01-28 DIAGNOSIS — Z833 Family history of diabetes mellitus: Secondary | ICD-10-CM

## 2016-01-28 DIAGNOSIS — O09899 Supervision of other high risk pregnancies, unspecified trimester: Secondary | ICD-10-CM

## 2016-01-28 DIAGNOSIS — Z3493 Encounter for supervision of normal pregnancy, unspecified, third trimester: Secondary | ICD-10-CM | POA: Diagnosis not present

## 2016-01-28 DIAGNOSIS — O99824 Streptococcus B carrier state complicating childbirth: Principal | ICD-10-CM | POA: Diagnosis present

## 2016-01-28 DIAGNOSIS — Z36 Encounter for antenatal screening of mother: Secondary | ICD-10-CM | POA: Diagnosis not present

## 2016-01-28 DIAGNOSIS — Z3A40 40 weeks gestation of pregnancy: Secondary | ICD-10-CM | POA: Diagnosis not present

## 2016-01-28 DIAGNOSIS — Z369 Encounter for antenatal screening, unspecified: Secondary | ICD-10-CM

## 2016-01-28 DIAGNOSIS — O9989 Other specified diseases and conditions complicating pregnancy, childbirth and the puerperium: Secondary | ICD-10-CM

## 2016-01-28 DIAGNOSIS — Z1389 Encounter for screening for other disorder: Secondary | ICD-10-CM

## 2016-01-28 DIAGNOSIS — Z3403 Encounter for supervision of normal first pregnancy, third trimester: Secondary | ICD-10-CM | POA: Diagnosis not present

## 2016-01-28 DIAGNOSIS — O99820 Streptococcus B carrier state complicating pregnancy: Secondary | ICD-10-CM

## 2016-01-28 DIAGNOSIS — Z283 Underimmunization status: Secondary | ICD-10-CM

## 2016-01-28 LAB — POCT URINALYSIS DIPSTICK
BILIRUBIN UA: NEGATIVE
Glucose, UA: NEGATIVE
Ketones, UA: NEGATIVE
NITRITE UA: NEGATIVE
PH UA: 6
Protein, UA: NEGATIVE
Spec Grav, UA: 1.015
UROBILINOGEN UA: NEGATIVE

## 2016-01-28 LAB — TYPE AND SCREEN
ABO/RH(D): O POS
Antibody Screen: NEGATIVE

## 2016-01-28 LAB — CBC
HEMATOCRIT: 35.1 % (ref 35.0–47.0)
HEMOGLOBIN: 12.4 g/dL (ref 12.0–16.0)
MCH: 32.2 pg (ref 26.0–34.0)
MCHC: 35.3 g/dL (ref 32.0–36.0)
MCV: 91.1 fL (ref 80.0–100.0)
Platelets: 222 10*3/uL (ref 150–440)
RBC: 3.85 MIL/uL (ref 3.80–5.20)
RDW: 14.8 % — ABNORMAL HIGH (ref 11.5–14.5)
WBC: 8.9 10*3/uL (ref 3.6–11.0)

## 2016-01-28 MED ORDER — AMPICILLIN SODIUM 2 G IJ SOLR
INTRAMUSCULAR | Status: AC
  Administered 2016-01-28: 2 g
  Filled 2016-01-28: qty 2000

## 2016-01-28 MED ORDER — OXYTOCIN 40 UNITS IN LACTATED RINGERS INFUSION - SIMPLE MED
1.0000 m[IU]/min | INTRAVENOUS | Status: DC
  Administered 2016-01-28: 1 m[IU]/min via INTRAVENOUS

## 2016-01-28 MED ORDER — LACTATED RINGERS IV SOLN
INTRAVENOUS | Status: DC
  Administered 2016-01-28 – 2016-01-29 (×3): via INTRAVENOUS

## 2016-01-28 MED ORDER — LIDOCAINE HCL (PF) 1 % IJ SOLN
30.0000 mL | INTRAMUSCULAR | Status: DC | PRN
  Administered 2016-01-29: 30 mL via SUBCUTANEOUS

## 2016-01-28 MED ORDER — SODIUM CHLORIDE 0.9 % IJ SOLN
INTRAMUSCULAR | Status: AC
  Filled 2016-01-28: qty 50

## 2016-01-28 MED ORDER — LIDOCAINE HCL (PF) 1 % IJ SOLN
INTRAMUSCULAR | Status: AC
  Filled 2016-01-28: qty 30

## 2016-01-28 MED ORDER — OXYTOCIN 10 UNIT/ML IJ SOLN
INTRAMUSCULAR | Status: AC
  Filled 2016-01-28: qty 2

## 2016-01-28 MED ORDER — LACTATED RINGERS IV SOLN: 500.0000 mL | INTRAVENOUS | Status: DC | PRN

## 2016-01-28 MED ORDER — OXYTOCIN BOLUS FROM INFUSION
500.0000 mL | Freq: Once | INTRAVENOUS | Status: AC
  Administered 2016-01-29: 500 mL via INTRAVENOUS

## 2016-01-28 MED ORDER — FENTANYL CITRATE (PF) 100 MCG/2ML IJ SOLN
50.0000 ug | INTRAMUSCULAR | Status: DC | PRN
  Administered 2016-01-29 (×4): 100 ug via INTRAVENOUS
  Filled 2016-01-28 (×4): qty 2

## 2016-01-28 MED ORDER — OXYTOCIN 40 UNITS IN LACTATED RINGERS INFUSION - SIMPLE MED: 2.5000 [IU]/h | INTRAVENOUS | Status: DC

## 2016-01-28 MED ORDER — ONDANSETRON HCL 4 MG/2ML IJ SOLN: 4.0000 mg | Freq: Four times a day (QID) | INTRAMUSCULAR | Status: DC | PRN

## 2016-01-28 MED ORDER — AMPICILLIN SODIUM 1 G IJ SOLR
1.0000 g | INTRAMUSCULAR | Status: AC
  Administered 2016-01-28 – 2016-01-29 (×6): 1 g via INTRAVENOUS
  Filled 2016-01-28 (×7): qty 1000

## 2016-01-28 MED ORDER — TERBUTALINE SULFATE 1 MG/ML IJ SOLN: 0.2500 mg | Freq: Once | INTRAMUSCULAR | Status: DC | PRN

## 2016-01-28 MED ORDER — MISOPROSTOL 200 MCG PO TABS
ORAL_TABLET | ORAL | Status: AC
  Filled 2016-01-28: qty 4

## 2016-01-28 MED ORDER — ACETAMINOPHEN 325 MG PO TABS: 650.0000 mg | ORAL_TABLET | ORAL | Status: DC | PRN

## 2016-01-28 MED ORDER — SOD CITRATE-CITRIC ACID 500-334 MG/5ML PO SOLN: 30.0000 mL | ORAL | Status: DC | PRN

## 2016-01-28 MED ORDER — AMMONIA AROMATIC IN INHA
RESPIRATORY_TRACT | Status: AC
  Filled 2016-01-28: qty 10

## 2016-01-28 NOTE — Progress Notes (Signed)
NONSTRESS TEST INTERPRETATION  INDICATIONS: POST DATES  FHR baseline: 130 RESULTS: REACTIVE COMMENTS: CONTRACTIONS EVERY 5 MINUTES   PLAN: 1. Continue fetal kick counts twice a day. 2. Continue antepartum testing as scheduled-Biweekly  Fenton Mallingebbie Jakarie Pember, LPN

## 2016-01-28 NOTE — Progress Notes (Signed)
Raven OmanSintia Barker is a 20 y.o. G1P0 at 7378w1d by LMP admitted for induction of labor due to Low amniotic fluid..  Subjective: Reports mild pain with contractions, rates a 5 on pain scale  Objective: BP 115/60   Pulse 94   Temp 97.8 F (36.6 C)   Resp 16   Ht 5\' 5"  (1.651 m)   Wt 181 lb (82.1 kg)   LMP 04/22/2015 (Approximate)   BMI 30.12 kg/m  No intake/output data recorded. No intake/output data recorded.  FHT:  FHR: 121 bpm, variability: moderate,  accelerations:  Present,  decelerations:  Absent UC:   irregular, every 2-4 minutes, mild to palpation, on 3 mu/min pitocin SVE:   Dilation: 3.5 Effacement (%): 70 Station: -2 Exam by:: M.Cade Olberding, CNM  Labs: Lab Results  Component Value Date   WBC 8.9 01/28/2016   HGB 12.4 01/28/2016   HCT 35.1 01/28/2016   MCV 91.1 01/28/2016   PLT 222 01/28/2016    Assessment / Plan: Induction of labor due to oligohydramnious,  progressing well on pitocin  Labor: oligohydramnious Preeclampsia:  labs stable Fetal Wellbeing:  Category I Pain Control:  Labor support without medications I/D:  n/a Anticipated MOD:  NSVD  Maree Ainley Suzan Nailer Aaryn Parrilla, CNM 01/28/2016, 5:24 PM

## 2016-01-28 NOTE — H&P (Deleted)
  The note originally documented on this encounter has been moved the the encounter in which it belongs.  

## 2016-01-28 NOTE — Progress Notes (Signed)
  Indications: EFW and AFI for Post Dates Findings:  Singleton intrauterine pregnancy is visualized with FHR at 144 BPM. Biometrics give an (U/S) Gestational age of [redacted] weeks and 3 days, and an (U/S) EDD of 02/08/16; this DOES NOT correlate with the clinically established EDD of 01/27/16.  Fetal presentation is vertex, spine anterior.  EFW: 3905 grams ( 8 lbs. 10 oz.) 77th percentile, Williams Placenta: Right lateral, grade 3 with calcifications AFI: OLIGOHYDRAMNIOS at 3.3 cm  Anatomic survey of the fetal stomach, bladder and kidneys appears WNL.  Impression: 1. 38 week 3 day Viable Singleton Intrauterine pregnancy by U/S. 2. (U/S) EDD IS NOT consistent with Clinically established (LMP) EDD of 01/27/16 ( 12 day difference- 02/08/16)/. 3. EFW: 3905 grams ( 8 lbs. 10 oz. )/ 4. OLIGOHYDRAMNIOS with AFI of 3.3 cm.   NST reactive with irregular contractions, sent to L& D for delivery

## 2016-01-28 NOTE — Progress Notes (Signed)
ROB

## 2016-01-28 NOTE — H&P (Signed)
Raven Barker is a 20 y.o. female presenting for IOL at 68w1dsecondary to o[place] OB History    Gravida Para Term Preterm AB Living   1             SAB TAB Ectopic Multiple Live Births                 Past Medical History:  Diagnosis Date  . History of anemia    History reviewed. No pertinent surgical history. Family History: family history includes Diabetes in her paternal grandmother. Social History:  reports that she has never smoked. She has never used smokeless tobacco. She reports that she does not drink alcohol or use drugs.     Maternal Diabetes: No Genetic Screening: Normal Maternal Ultrasounds/Referrals: Normal Fetal Ultrasounds or other Referrals:  None Maternal Substance Abuse:  No Significant Maternal Medications:  None Significant Maternal Lab Results:  None Other Comments:  None  ROS History   Blood pressure 126/67, pulse (!) 101, weight 181 lb 11.2 oz (82.4 kg), last menstrual period 04/22/2015. Exam Physical Exam  A&O x4  well groomed female HRR Lungs clear Abdomen gravid and mild contractions palpated Mild pedal edema Prenatal labs: ABO, Rh: O/Positive/-- (02/01 1130) Antibody: Negative (01/19 1052) Rubella: <20.0 (01/19 1052) RPR: Non Reactive (01/19 1052)  HBsAg: Negative (01/19 1052)  HIV: Non Reactive (01/19 1052)  GBS: Positive (07/18 1649)   Assessment/Plan: Oligohydramnious, IOL with pitocin per policy   Raven Barker N Raven Barker 01/28/2016, 9:43 AM   Obstetric History and Physical  Raven Barker a 20y.o. G1P0 with IUP at 451w1dresenting with low fluid and irregular contractions. Patient states she has been having  irregular, every 5-7 minutes contractions, none vaginal bleeding, intact membranes, with active fetal movement.    [facility]  Prenatal labs and studies: ABO, Rh: O/Positive/-- (02/01 1130) Antibody: Negative (01/19 1052) Rubella:  <20.0 (01/19 1052) RPR: Non Reactive (01/19 1052)  HBsAg: Negative (01/19 1052)  HIV: Non Reactive (01/19 1052)  [id number]/18 1649) 1 hr Glucola  normal Genetic screening normal Anatomy [place]  Past Medical History:  Diagnosis Date  . History of anemia     History reviewed. No pertinent surgical history.  OB History  Gravida Para Term Preterm AB Living  1            SAB TAB Ectopic Multiple Live Births               # Outcome Date GA Lbr Len/2nd Weight Sex Delivery Anes PTL Lv  1 Current               Social History   Social History  . Marital status: Single    Spouse name: N/A  . Number of children: N/A  . Years of education: N/A   Social History Main Topics  . Smoking status: Never Smoker  . Smokeless tobacco: Never Used  . Alcohol use No  . Drug use: No  . Sexual activity: Yes    Partners: Male   Other Topics Concern  . None   Social History Narrative  . None    Family History  Problem Relation Age of Onset  . Diabetes Paternal Grandmother      (Not in a hospital admission)  No Known Allergies  Review of Systems: Negative except for what is mentioned in HPI.  Physical Exam: BP 126/67   Pulse (!) 101   Wt 181 lb  11.2 oz (82.4 kg)   LMP 04/22/2015 (Approximate)   BMI 30.24 kg/m  GENERAL: Well-developed, well-nourished female in no acute distress.  LUNGS: Clear to auscultation bilaterally.  HEART: Regular rate and rhythm. ABDOMEN: Soft, nontender, nondistended, gravid. EXTREMITIES: Nontender, no edema, 2+ distal pulses. Cervical Exam:   FHT:  Baseline rate 140 bpm   Variability moderate  Accelerations present   Decelerations none Contractions: Every 5-7 mins   Pertinent Labs/Studies:   Results for orders placed or performed in visit on 01/28/16 (from the past 24 hour(s))  POCT urinalysis dipstick     Status: Abnormal   Collection Time: 01/28/16  9:34 AM  Result Value Ref Range   Color, UA yellow    Clarity, UA cloudy     Glucose, UA negative    Bilirubin, UA negative    Ketones, UA negative    Spec Grav, UA 1.015    Blood, UA large    pH, UA 6.0    Protein, UA negative    Urobilinogen, UA negative    Nitrite, UA negative    Leukocytes, UA Trace (A) Negative    Assessment : Raven Barker is a 20 y.o. G1P0 at 45w1dbeing admitted for labor.  Plan: Labor: Expectant management.  Induction/Augmentation as needed, per protocol FWB: Reassuring fetal heart tracing.  GBS positive Delivery plan: Hopeful for vaginal delivery  Raven Barker, CNM [facility]

## 2016-01-29 DIAGNOSIS — Z3403 Encounter for supervision of normal first pregnancy, third trimester: Secondary | ICD-10-CM

## 2016-01-29 LAB — RPR: RPR: NONREACTIVE

## 2016-01-29 MED ORDER — SENNOSIDES-DOCUSATE SODIUM 8.6-50 MG PO TABS
2.0000 | ORAL_TABLET | ORAL | Status: DC
  Administered 2016-01-30: 2 via ORAL

## 2016-01-29 MED ORDER — OXYCODONE HCL 5 MG PO TABS: 10.0000 mg | ORAL_TABLET | ORAL | Status: DC | PRN

## 2016-01-29 MED ORDER — ONDANSETRON HCL 4 MG PO TABS: 4.0000 mg | ORAL_TABLET | ORAL | Status: DC | PRN

## 2016-01-29 MED ORDER — DOCUSATE SODIUM 100 MG PO CAPS
100.0000 mg | ORAL_CAPSULE | Freq: Two times a day (BID) | ORAL | Status: DC
  Administered 2016-01-30 – 2016-01-31 (×3): 100 mg via ORAL
  Filled 2016-01-29 (×3): qty 1

## 2016-01-29 MED ORDER — COCONUT OIL OIL
1.0000 "application " | TOPICAL_OIL | Status: DC | PRN
  Filled 2016-01-29: qty 120

## 2016-01-29 MED ORDER — PRENATAL MULTIVITAMIN CH
1.0000 | ORAL_TABLET | Freq: Every day | ORAL | Status: DC
  Administered 2016-01-30 – 2016-01-31 (×2): 1 via ORAL
  Filled 2016-01-29 (×2): qty 1

## 2016-01-29 MED ORDER — SIMETHICONE 80 MG PO CHEW: 80.0000 mg | CHEWABLE_TABLET | ORAL | Status: DC | PRN

## 2016-01-29 MED ORDER — WITCH HAZEL-GLYCERIN EX PADS
1.0000 | MEDICATED_PAD | CUTANEOUS | Status: DC | PRN
Start: 2016-01-29 — End: 2016-01-31

## 2016-01-29 MED ORDER — ONDANSETRON HCL 4 MG/2ML IJ SOLN: 4.0000 mg | INTRAMUSCULAR | Status: DC | PRN

## 2016-01-29 MED ORDER — OXYCODONE HCL 5 MG PO TABS: 5.0000 mg | ORAL_TABLET | ORAL | Status: DC | PRN

## 2016-01-29 MED ORDER — MEASLES, MUMPS & RUBELLA VAC ~~LOC~~ INJ
0.5000 mL | INJECTION | Freq: Once | SUBCUTANEOUS | Status: DC
  Filled 2016-01-29: qty 0.5

## 2016-01-29 MED ORDER — DIBUCAINE 1 % RE OINT: 1.0000 "application " | TOPICAL_OINTMENT | RECTAL | Status: DC | PRN

## 2016-01-29 MED ORDER — FERROUS SULFATE 325 (65 FE) MG PO TABS
325.0000 mg | ORAL_TABLET | Freq: Every day | ORAL | Status: DC
  Administered 2016-01-30 – 2016-01-31 (×2): 325 mg via ORAL
  Filled 2016-01-29 (×2): qty 1

## 2016-01-29 MED ORDER — ACETAMINOPHEN 325 MG PO TABS: 650.0000 mg | ORAL_TABLET | ORAL | Status: DC | PRN

## 2016-01-29 MED ORDER — DIPHENHYDRAMINE HCL 25 MG PO CAPS: 25.0000 mg | ORAL_CAPSULE | Freq: Four times a day (QID) | ORAL | Status: DC | PRN

## 2016-01-29 MED ORDER — BENZOCAINE-MENTHOL 20-0.5 % EX AERO
1.0000 "application " | INHALATION_SPRAY | CUTANEOUS | Status: DC | PRN
  Administered 2016-01-30: 1 via TOPICAL
  Filled 2016-01-29 (×2): qty 56

## 2016-01-29 MED ORDER — IBUPROFEN 600 MG PO TABS
600.0000 mg | ORAL_TABLET | Freq: Four times a day (QID) | ORAL | Status: DC
  Administered 2016-01-30 – 2016-01-31 (×7): 600 mg via ORAL
  Filled 2016-01-29 (×7): qty 1

## 2016-01-29 NOTE — Progress Notes (Signed)
Raven Barker is a 20 y.o. G1P0 at 3740w2d by LMP admitted for induction of labor due to Low amniotic fluid..  Subjective: Reports pain a 8 on pain scale, also just noted leaking fluid  Objective: BP (!) 122/54   Pulse 86   Temp 98.1 F (36.7 C) (Oral)   Resp 20   Ht 5\' 5"  (1.651 m)   Wt 181 lb (82.1 kg)   LMP 04/22/2015 (Approximate)   BMI 30.12 kg/m  I/O last 3 completed shifts: In: 2764.6 [I.V.:2564.6; IV Piggyback:200] Out: -  No intake/output data recorded.  FHT:  FHR: 140 bpm, variability: moderate,  accelerations:  Present,  decelerations:  Absent UC:   regular, every 3-5 minutes, mild to palpation,on 2612mu/min pitocin SVE:   Dilation: 5 Effacement (%): 90 Station: -1, -2 Exam by:: Darek Eifler, scant amount clear fluid noted  Labs: Lab Results  Component Value Date   WBC 8.9 01/28/2016   HGB 12.4 01/28/2016   HCT 35.1 01/28/2016   MCV 91.1 01/28/2016   PLT 222 01/28/2016    Assessment / Plan: Induction of labor due to oligohydramnious,  progressing well on pitocin  Labor: Progressing normally Preeclampsia:  labs stable Fetal Wellbeing:  Category I Pain Control:  Labor support without medications and IV pain meds I/D:  n/a Anticipated MOD:  NSVD  Marrah Vanevery N Meryn Sarracino 01/29/2016, 10:12 AM

## 2016-01-29 NOTE — Progress Notes (Signed)
Raven Barker is a 20 y.o. G1P0 at 6438w2d by LMP admitted for induction of labor due to Low amniotic fluid..  Subjective: Reports pressure with contractions, states fentanyl isn't helping as much  Objective: BP (!) 122/54   Pulse 86   Temp 98.6 F (37 C) (Oral)   Resp 20   Ht 5\' 5"  (1.651 m)   Wt 181 lb (82.1 kg)   LMP 04/22/2015 (Approximate)   BMI 30.12 kg/m  I/O last 3 completed shifts: In: 2764.6 [I.V.:2564.6; IV Piggyback:200] Out: -  No intake/output data recorded.  FHT:  FHR: 144 bpm, variability: moderate,  accelerations:  Present,  decelerations:  Present early with contractions UC:   regular, every 2-5 minutes, with coupling, on 1018mu/min pitocin SVE:   8.5/100/0 Labs: Lab Results  Component Value Date   WBC 8.9 01/28/2016   HGB 12.4 01/28/2016   HCT 35.1 01/28/2016   MCV 91.1 01/28/2016   PLT 222 01/28/2016    Assessment / Plan: Induction of labor due to oligohydramnious,  progressing well on pitocin  Labor: Progressing normally Preeclampsia:  labs stable Fetal Wellbeing:  Category I Pain Control:  IV pain meds I/D:  Raven/a Anticipated MOD:  NSVD  Raven Barker Raven Barker 01/29/2016, 3:10 PM

## 2016-01-30 LAB — CBC
HCT: 26.2 % — ABNORMAL LOW (ref 35.0–47.0)
HEMOGLOBIN: 9.1 g/dL — AB (ref 12.0–16.0)
MCH: 31.6 pg (ref 26.0–34.0)
MCHC: 34.9 g/dL (ref 32.0–36.0)
MCV: 90.7 fL (ref 80.0–100.0)
PLATELETS: 179 10*3/uL (ref 150–440)
RBC: 2.88 MIL/uL — AB (ref 3.80–5.20)
RDW: 15.1 % — ABNORMAL HIGH (ref 11.5–14.5)
WBC: 8.7 10*3/uL (ref 3.6–11.0)

## 2016-01-30 NOTE — Progress Notes (Signed)
Post Partum Day 1 Subjective: no complaints, up ad lib and voiding  Objective: Blood pressure (!) 101/51, pulse 93, temperature 97.7 F (36.5 C), temperature source Oral, resp. rate 18, height 5\' 5"  (1.651 m), weight 181 lb (82.1 kg), last menstrual period 04/22/2015, SpO2 100 %, unknown if currently breastfeeding.  Physical Exam:  General: alert, cooperative and appears stated age Lochia: appropriate Uterine Fundus: firm Incision: NA DVT Evaluation: No evidence of DVT seen on physical exam. Negative Homan's sign.   Recent Labs  01/28/16 1046 01/30/16 0506  HGB 12.4 9.1*  HCT 35.1 26.2*    Assessment/Plan: Plan for discharge tomorrow and Breastfeeding Infant feeding only Breast;    LOS: 2 days   Sun MicrosystemsMelody N Shambley, CNM  01/30/2016, 8:52 AM

## 2016-01-31 MED ORDER — NORETHINDRONE 0.35 MG PO TABS: 1.0000 | ORAL_TABLET | Freq: Every day | ORAL | 11 refills | Status: DC

## 2016-01-31 MED ORDER — FUSION PLUS PO CAPS: 1.0000 | ORAL_CAPSULE | Freq: Every day | ORAL | 1 refills | Status: DC

## 2016-01-31 MED ORDER — VITAMIN D3 125 MCG (5000 UT) PO CAPS: 1.0000 | ORAL_CAPSULE | Freq: Every day | ORAL | 2 refills | Status: DC

## 2016-01-31 NOTE — Progress Notes (Signed)
Discharge instructions given. Patient verbalizes understanding of teaching. Patient discharged home at 1305.

## 2016-01-31 NOTE — Discharge Summary (Signed)
Obstetric Discharge Summary Reason for Admission: induction of labor Prenatal Procedures: NST and ultrasound Intrapartum Procedures: spontaneous vaginal delivery and GBS prophylaxis Postpartum Procedures: Rubella Ig Complications-Operative and Postpartum: 2nd degree perineal laceration Hemoglobin  Date Value Ref Range Status  01/30/2016 9.1 (L) 12.0 - 16.0 g/dL Final   HCT  Date Value Ref Range Status  01/30/2016 26.2 (L) 35.0 - 47.0 % Final   Hematocrit  Date Value Ref Range Status  11/05/2015 31.1 (L) 34.0 - 46.6 % Final    Physical Exam:  General: alert, cooperative and appears stated age 73Lochia: appropriate Uterine Fundus: firm Incision: NA DVT Evaluation: No evidence of DVT seen on physical exam. Negative Homan's sign.  Discharge Diagnoses: Term Pregnancy-delivered  Discharge Information: Date: 01/31/2016 Activity: pelvic rest Diet: routine Medications: PNV, Ibuprofen, Colace, Iron and Camilla BC & vit D3 Condition: stable Instructions: refer to practice specific booklet Discharge to: home   Newborn Data: Live born female (no circumcision) Birth Weight: 8 lb 5.3 oz (3780 g) APGAR: 8, 9  Home with mother.  Kellyanne Ellwanger N Edwina Grossberg,CNM 01/31/2016, 8:29 AMc

## 2016-03-10 ENCOUNTER — Ambulatory Visit (INDEPENDENT_AMBULATORY_CARE_PROVIDER_SITE_OTHER): Payer: Medicaid Other | Admitting: Obstetrics and Gynecology

## 2016-03-10 ENCOUNTER — Encounter: Payer: Self-pay | Admitting: Obstetrics and Gynecology

## 2016-03-10 MED ORDER — MEDROXYPROGESTERONE ACETATE 150 MG/ML IM SUSP: 150.0000 mg | INTRAMUSCULAR | 4 refills | Status: DC

## 2016-03-10 NOTE — Patient Instructions (Addendum)
Place postpartum visit patient instructions here.   Medroxyprogesterone injection [Contraceptive] What is this medicine? MEDROXYPROGESTERONE (me DROX ee proe JES te rone) contraceptive injections prevent pregnancy. They provide effective birth control for 3 months. Depo-subQ Provera 104 is also used for treating pain related to endometriosis. This medicine may be used for other purposes; ask your health care provider or pharmacist if you have questions. What should I tell my health care provider before I take this medicine? They need to know if you have any of these conditions: -frequently drink alcohol -asthma -blood vessel disease or a history of a blood clot in the lungs or legs -bone disease such as osteoporosis -breast cancer -diabetes -eating disorder (anorexia nervosa or bulimia) -high blood pressure -HIV infection or AIDS -kidney disease -liver disease -mental depression -migraine -seizures (convulsions) -stroke -tobacco smoker -vaginal bleeding -an unusual or allergic reaction to medroxyprogesterone, other hormones, medicines, foods, dyes, or preservatives -pregnant or trying to get pregnant -breast-feeding How should I use this medicine? Depo-Provera Contraceptive injection is given into a muscle. Depo-subQ Provera 104 injection is given under the skin. These injections are given by a health care professional. You must not be pregnant before getting an injection. The injection is usually given during the first 5 days after the start of a menstrual period or 6 weeks after delivery of a baby. Talk to your pediatrician regarding the use of this medicine in children. Special care may be needed. These injections have been used in female children who have started having menstrual periods. Overdosage: If you think you have taken too much of this medicine contact a poison control center or emergency room at once. NOTE: This medicine is only for you. Do not share this medicine with  others. What if I miss a dose? Try not to miss a dose. You must get an injection once every 3 months to maintain birth control. If you cannot keep an appointment, call and reschedule it. If you wait longer than 13 weeks between Depo-Provera contraceptive injections or longer than 14 weeks between Depo-subQ Provera 104 injections, you could get pregnant. Use another method for birth control if you miss your appointment. You may also need a pregnancy test before receiving another injection. What may interact with this medicine? Do not take this medicine with any of the following medications: -bosentan This medicine may also interact with the following medications: -aminoglutethimide -antibiotics or medicines for infections, especially rifampin, rifabutin, rifapentine, and griseofulvin -aprepitant -barbiturate medicines such as phenobarbital or primidone -bexarotene -carbamazepine -medicines for seizures like ethotoin, felbamate, oxcarbazepine, phenytoin, topiramate -modafinil -St. John's wort This list may not describe all possible interactions. Give your health care provider a list of all the medicines, herbs, non-prescription drugs, or dietary supplements you use. Also tell them if you smoke, drink alcohol, or use illegal drugs. Some items may interact with your medicine. What should I watch for while using this medicine? This drug does not protect you against HIV infection (AIDS) or other sexually transmitted diseases. Use of this product may cause you to lose calcium from your bones. Loss of calcium may cause weak bones (osteoporosis). Only use this product for more than 2 years if other forms of birth control are not right for you. The longer you use this product for birth control the more likely you will be at risk for weak bones. Ask your health care professional how you can keep strong bones. You may have a change in bleeding pattern or irregular periods. Many females stop having  periods  while taking this drug. If you have received your injections on time, your chance of being pregnant is very low. If you think you may be pregnant, see your health care professional as soon as possible. Tell your health care professional if you want to get pregnant within the next year. The effect of this medicine may last a long time after you get your last injection. What side effects may I notice from receiving this medicine? Side effects that you should report to your doctor or health care professional as soon as possible: -allergic reactions like skin rash, itching or hives, swelling of the face, lips, or tongue -breast tenderness or discharge -breathing problems -changes in vision -depression -feeling faint or lightheaded, falls -fever -pain in the abdomen, chest, groin, or leg -problems with balance, talking, walking -unusually weak or tired -yellowing of the eyes or skin Side effects that usually do not require medical attention (report to your doctor or health care professional if they continue or are bothersome): -acne -fluid retention and swelling -headache -irregular periods, spotting, or absent periods -temporary pain, itching, or skin reaction at site where injected -weight gain This list may not describe all possible side effects. Call your doctor for medical advice about side effects. You may report side effects to FDA at 1-800-FDA-1088. Where should I keep my medicine? This does not apply. The injection will be given to you by a health care professional. NOTE: This sheet is a summary. It may not cover all possible information. If you have questions about this medicine, talk to your doctor, pharmacist, or health care provider.    2016, [employer] Standard. (2008-06-26 18:37:56)

## 2016-03-10 NOTE — Progress Notes (Signed)
  Subjective:     Raven Barker is a 20 y.o. female who presents for a postpartum visit. She is 6 weeks postpartum following a spontaneous vaginal delivery. I have fully reviewed the prenatal and intrapartum course. The delivery was at 40 gestational weeks. Outcome: spontaneous vaginal delivery. Anesthesia: epidural. Postpartum course has been `. Baby's course has been uncomplicated. Baby is feeding by formula since 2 weeks. Bleeding moderate lochia. Bowel function is normal. Bladder function is normal. Patient is sexually active. Contraception method is oral progesterone-only contraceptive. Postpartum depression screening: negative.  The following portions of the patient's history were reviewed and updated as appropriate: allergies, current medications, past family history, past medical history, past social history, past surgical history and problem list.  Review of Systems A comprehensive review of systems was negative.   Objective:    BP 117/77   Pulse (!) 105   Ht 5\' 4"  (1.626 m)   Wt 160 lb 1.6 oz (72.6 kg)   Breastfeeding? No   BMI 27.48 kg/m   General:  alert, cooperative and appears stated age   Breasts:  not checked  Lungs: clear to auscultation bilaterally  Heart:  regular rate and rhythm, S1, S2 normal, no murmur, click, rub or gallop  Abdomen: soft, non-tender; bowel sounds normal; no masses,  no organomegaly   Vulva:  normal  Vagina: normal vagina, no discharge, exudate, lesion, or erythema  Cervix:  multiparous appearance  Corpus: normal size, contour, position, consistency, mobility, non-tender  Adnexa:  normal adnexa and no mass, fullness, tenderness  Rectal Exam: no masses, tenderness, nodules        Assessment:     6 weeks postpartum exam. Pap smear not done at today's visit.   Plan:    1. Contraception: Depo-Provera injections 2. Labs checked for anemia 3. Follow up in: 3 weeks or as needed.

## 2016-03-11 LAB — CBC
HEMOGLOBIN: 12.4 g/dL (ref 11.1–15.9)
Hematocrit: 36.7 % (ref 34.0–46.6)
MCH: 30 pg (ref 26.6–33.0)
MCHC: 33.8 g/dL (ref 31.5–35.7)
MCV: 89 fL (ref 79–97)
Platelets: 336 10*3/uL (ref 150–379)
RBC: 4.14 x10E6/uL (ref 3.77–5.28)
RDW: 13.3 % (ref 12.3–15.4)
WBC: 7.3 10*3/uL (ref 3.4–10.8)

## 2016-03-11 LAB — VITAMIN D 25 HYDROXY (VIT D DEFICIENCY, FRACTURES): VIT D 25 HYDROXY: 33.1 ng/mL (ref 30.0–100.0)

## 2016-03-11 LAB — IRON: IRON: 51 ug/dL (ref 27–159)

## 2016-03-23 ENCOUNTER — Encounter: Payer: Self-pay | Admitting: Emergency Medicine

## 2016-03-23 ENCOUNTER — Emergency Department
Admission: EM | Admit: 2016-03-23 | Discharge: 2016-03-23 | Disposition: A | Discharge status: Home / Self Care | Payer: Medicaid Other | Attending: Emergency Medicine | Admitting: Emergency Medicine

## 2016-03-23 ENCOUNTER — Emergency Department: Payer: Medicaid Other

## 2016-03-23 DIAGNOSIS — R748 Abnormal levels of other serum enzymes: Secondary | ICD-10-CM | POA: Insufficient documentation

## 2016-03-23 DIAGNOSIS — K8020 Calculus of gallbladder without cholecystitis without obstruction: Secondary | ICD-10-CM | POA: Insufficient documentation

## 2016-03-23 DIAGNOSIS — R109 Unspecified abdominal pain: Secondary | ICD-10-CM | POA: Diagnosis present

## 2016-03-23 LAB — URINALYSIS COMPLETE WITH MICROSCOPIC (ARMC ONLY)
BACTERIA UA: NONE SEEN
Bilirubin Urine: NEGATIVE
GLUCOSE, UA: NEGATIVE mg/dL
HGB URINE DIPSTICK: NEGATIVE
Ketones, ur: NEGATIVE mg/dL
NITRITE: NEGATIVE
PH: 5 (ref 5.0–8.0)
Protein, ur: NEGATIVE mg/dL
SPECIFIC GRAVITY, URINE: 1.018 (ref 1.005–1.030)

## 2016-03-23 LAB — COMPREHENSIVE METABOLIC PANEL
ALBUMIN: 4.3 g/dL (ref 3.5–5.0)
ALK PHOS: 102 U/L (ref 38–126)
ALT: 147 U/L — ABNORMAL HIGH (ref 14–54)
ANION GAP: 7 (ref 5–15)
AST: 173 U/L — ABNORMAL HIGH (ref 15–41)
BILIRUBIN TOTAL: 1.1 mg/dL (ref 0.3–1.2)
BUN: 12 mg/dL (ref 6–20)
CALCIUM: 9.5 mg/dL (ref 8.9–10.3)
CO2: 28 mmol/L (ref 22–32)
Chloride: 103 mmol/L (ref 101–111)
Creatinine, Ser: 0.67 mg/dL (ref 0.44–1.00)
GLUCOSE: 115 mg/dL — AB (ref 65–99)
POTASSIUM: 3.7 mmol/L (ref 3.5–5.1)
Sodium: 138 mmol/L (ref 135–145)
TOTAL PROTEIN: 8.8 g/dL — AB (ref 6.5–8.1)

## 2016-03-23 LAB — CBC
HEMATOCRIT: 38.2 % (ref 35.0–47.0)
HEMOGLOBIN: 13.4 g/dL (ref 12.0–16.0)
MCH: 30.9 pg (ref 26.0–34.0)
MCHC: 35 g/dL (ref 32.0–36.0)
MCV: 88.4 fL (ref 80.0–100.0)
Platelets: 295 10*3/uL (ref 150–440)
RBC: 4.32 MIL/uL (ref 3.80–5.20)
RDW: 13.1 % (ref 11.5–14.5)
WBC: 10.2 10*3/uL (ref 3.6–11.0)

## 2016-03-23 LAB — LIPASE, BLOOD: Lipase: 19 U/L (ref 11–51)

## 2016-03-23 LAB — POCT PREGNANCY, URINE: PREG TEST UR: NEGATIVE

## 2016-03-23 MED ORDER — DOCUSATE SODIUM 100 MG PO CAPS: ORAL_CAPSULE | ORAL | 0 refills | Status: DC

## 2016-03-23 MED ORDER — HYDROCODONE-ACETAMINOPHEN 5-325 MG PO TABS: 1.0000 | ORAL_TABLET | ORAL | 0 refills | Status: DC | PRN

## 2016-03-23 MED ORDER — ONDANSETRON HCL 4 MG/2ML IJ SOLN
4.0000 mg | INTRAMUSCULAR | Status: AC
  Administered 2016-03-23: 4 mg via INTRAVENOUS
  Filled 2016-03-23: qty 2

## 2016-03-23 MED ORDER — ONDANSETRON 4 MG PO TBDP: ORAL_TABLET | ORAL | 0 refills | Status: DC

## 2016-03-23 NOTE — ED Provider Notes (Signed)
[facility] Emergency Department Provider Note  ____________________________________________   First MD Initiated Contact with Patient 03/23/16 [phone]     (approximate)  I have reviewed the triage vital signs and the nursing notes.   HISTORY  Chief Complaint Abdominal Pain    HPI Raven Barker is a 20 y.o. female with no significant past medical history who presents for evaluation of episodes of vomiting and upper abdominal pain.  She states that these episodes only happen at night and that happened about 3 different times over the last couple of months.  Most recently it happened just prior to arrival.  She has not sought medical care for it previously.  The pain has been going on about an hour and a half and she describes it as mild at this time but was severe initially.  She is not certain that it has anything to do with her eating habits.  Nothing in particular makes it better and lying flat makes it worse.  She has no known history of acid reflux/GERD.  She denies lower abdominal pain.  The symptoms tend to resolve after she has been upright and awake for a while.  She denies fever/chills, chest pain, shortness of breath, lower abdominal pain, dysuria.  Her last menstrual period was a couple of weeks ago.  She gave birth about 2 months ago and the symptoms started right after her pregnancy ended.  She describes the pain as sharp and stabbing but not necessarily burning.   Past Medical History:  Diagnosis Date  . History of anemia   . Medical history non-contributory     There are no active problems to display for this patient.   History reviewed. No pertinent surgical history.  Prior to Admission medications   Medication Sig Start Date End Date Taking? Authorizing Provider  Cholecalciferol (VITAMIN D3) 5000 units CAPS Take 1 capsule (5,000 Units total) by mouth daily. 01/31/16   Melody N Shambley, CNM  docusate sodium (COLACE) 100 MG capsule Take 1  tablet once or twice daily as needed for constipation while taking narcotic pain medicine 03/23/16   Loleta Roseory Klever Twyford, MD  HYDROcodone-acetaminophen (NORCO/VICODIN) 5-325 MG tablet Take 1-2 tablets by mouth every 4 (four) hours as needed for moderate pain. 03/23/16   Loleta Roseory George Haggart, MD  Iron-FA-B Cmp-C-Biot-Probiotic (FUSION PLUS) CAPS Take 1 capsule by mouth daily. 01/31/16   Melody N Shambley, CNM  medroxyPROGESTERone (DEPO-PROVERA) 150 MG/ML injection Inject 1 mL (150 mg total) into the muscle every 3 (three) months. 03/10/16   Melody N Shambley, CNM  ondansetron (ZOFRAN ODT) 4 MG disintegrating tablet Allow 1-2 tablets to dissolve in your mouth every 8 hours as needed for nausea/vomiting 03/23/16   Loleta Roseory Bilbo Carcamo, MD    Allergies Review of patient's allergies indicates no known allergies.  Family History  Problem Relation Age of Onset  . Diabetes Paternal Grandmother     Social History Social History  Substance Use Topics  . Smoking status: Never Smoker  . Smokeless tobacco: Never Used  . Alcohol use No    Review of Systems Constitutional: No fever/chills Eyes: No visual changes. ENT: No sore throat. Cardiovascular: Denies chest pain. Respiratory: Denies shortness of breath. Gastrointestinal: Upper abd pain w/ N/V, episodic Genitourinary: Negative for dysuria. Musculoskeletal: Negative for back pain. Skin: Negative for rash. Neurological: Negative for headaches, focal weakness or numbness.  10-point ROS otherwise negative.  ____________________________________________   PHYSICAL EXAM:  VITAL SIGNS: ED Triage Vitals  Enc Vitals Group  BP --      Pulse --      Resp --      Temp --      Temp src --      SpO2 --      Weight 03/23/16 0449 160 lb (72.6 kg)     Height 03/23/16 0449 5\' 5"  (1.651 m)     Head Circumference --      Peak Flow --      Pain Score 03/23/16 0458 8     Pain Loc --      Pain Edu? --      Excl. in GC? --     Constitutional: Alert and oriented.  Well appearing and in no acute distress. Eyes: Conjunctivae are normal. PERRL. EOMI. Head: Atraumatic. Nose: No congestion/rhinnorhea. Mouth/Throat: Mucous membranes are moist.  Oropharynx non-erythematous. Neck: No stridor.  No meningeal signs.   Cardiovascular: Normal rate, regular rhythm. Good peripheral circulation. Grossly normal heart sounds. Respiratory: Normal respiratory effort.  No retractions. Lungs CTAB. Gastrointestinal: Soft With no lower abdominal pain but moderate tenderness to palpation of the epigastrium and right upper quadrant (positive Murphy sign) and guarding, no rebound Musculoskeletal: No lower extremity tenderness nor edema. No gross deformities of extremities. Neurologic:  Normal speech and language. No gross focal neurologic deficits are appreciated.  Skin:  Skin is warm, dry and intact. No rash noted. Psychiatric: Mood and affect are normal. Speech and behavior are normal.  ____________________________________________   LABS (all labs ordered are listed, but only abnormal results are displayed)  Labs Reviewed  COMPREHENSIVE METABOLIC PANEL - Abnormal; Notable for the following:       Result Value   Glucose, Bld 115 (*)    Total Protein 8.8 (*)    AST 173 (*)    ALT 147 (*)    All other components within normal limits  URINALYSIS COMPLETEWITH MICROSCOPIC ([facility] ONLY) - Abnormal; Notable for the following:    Color, Urine YELLOW (*)    APPearance HAZY (*)    Leukocytes, UA 2+ (*)    Squamous Epithelial / LPF 6-30 (*)    All other components within normal limits  LIPASE, BLOOD  CBC  POC URINE PREG, ED  POCT PREGNANCY, URINE   ____________________________________________  EKG  None - EKG not ordered by ED physician ____________________________________________  RADIOLOGY   US Abdomen Limited Ruq  Result Date: 03/23/2016 CLINICAL DATA:  Right upper quadrant and epigastric pain with nausea and vomiting for 2 weeks. EXAM: US ABDOMEN LIMITED -  RIGHT UPPER QUADRANT COMPARISON:  None. FINDINGS: Gallbladder: Multiple small mobile stones in the dependent portion of the gallbladder. Largest measures about 5 mm. No gallbladder wall thickening or edema. Murphy's sign is negative. Common bile duct: Diameter: 2.8 mm, normal Liver: Diffusely increased hepatic parenchymal echotexture suggesting fatty infiltration. No focal lesions identified. IMPRESSION: Cholelithiasis with small stones in the gallbladder. No additional findings to suggest cholecystitis. Electronically Signed   By: Burman Nieves M.D.   On: 03/23/2016 06:09    ____________________________________________   PROCEDURES  Procedure(s) performed:   Procedures   Critical Care performed: No ____________________________________________   INITIAL IMPRESSION / ASSESSMENT AND PLAN / ED COURSE  Pertinent labs & imaging results that were available during my care of the patient were reviewed by me and considered in my medical decision making (see chart for details).  The 2 top items on my differential diagnosis are biliary colic and acid reflux (particularly given that it only happens when she is  supine at night).  We will evaluate with lab work and an ultrasound.  She currently is declining any pain medication but will accept some antiemetics.   Clinical Course  Comment By Time  AST and ALT are very slightly elevated, but the patient states that she feels much better and is ready to go home.  She has evidence of cholelithiasis without cholecystitis.  Her common bile duct is normal diameter.  There is no evidence of choledocholithiasis at this time.  I gave her my usual customary discussion about biliary colic and my usual return precautions.  She will call the surgeon to schedule an outpatient visit. Loleta Rose, MD 10/05 [phone]    ____________________________________________  FINAL CLINICAL IMPRESSION(S) / ED DIAGNOSES  Final diagnoses:  Cholelithiasis without cholecystitis    Elevated liver enzymes     MEDICATIONS GIVEN DURING THIS VISIT:  Medications  ondansetron (ZOFRAN) injection 4 mg (4 mg Intravenous Given 03/23/16 0524)     NEW OUTPATIENT MEDICATIONS STARTED DURING THIS VISIT:  New Prescriptions   DOCUSATE SODIUM (COLACE) 100 MG CAPSULE    Take 1 tablet once or twice daily as needed for constipation while taking narcotic pain medicine   HYDROCODONE-ACETAMINOPHEN (NORCO/VICODIN) 5-325 MG TABLET    Take 1-2 tablets by mouth every 4 (four) hours as needed for moderate pain.   ONDANSETRON (ZOFRAN ODT) 4 MG DISINTEGRATING TABLET    Allow 1-2 tablets to dissolve in your mouth every 8 hours as needed for nausea/vomiting    Modified Medications   No medications on file    Discontinued Medications   No medications on file     Note:  This document was prepared using Dragon voice recognition software and may include unintentional dictation errors.    Loleta Rose, MD 03/23/16 [phone]

## 2016-03-23 NOTE — ED Notes (Signed)
Patient urged to urinate, collection hat placed in toilet.

## 2016-03-23 NOTE — ED Notes (Signed)
MD at bedside. 

## 2016-03-23 NOTE — Discharge Instructions (Signed)
You have been seen in the Emergency Department (ED) for abdominal pain.  Your evaluation suggests that your pain is caused by gallstones.  Fortunately you do not need immediate surgery at this time, but it is important that you follow up with a surgeon as an outpatient; typically surgical removal of the gallbladder is the only thing that will definitively fix your issue.  Read through the included information about a bland diet, and use any prescribed medications as instructed.  Avoid smoking and alcohol use. ? ?Please follow up as instructed above regarding today?s emergent visit and the symptoms that are bothering you. ? ?Take Norco as prescribed. Do not drink alcohol, drive or participate in any other potentially dangerous activities while taking this medication as it may make you sleepy. Do not take this medication with any other sedating medications, either prescription or over-the-counter. If you were prescribed Percocet or Vicodin, do not take these with acetaminophen (Tylenol) as it is already contained within these medications. ?  ?This medication is an opiate (or narcotic) pain medication and can be habit forming.  Use it as little as possible to achieve adequate pain control.  Do not use or use it with extreme caution if you have a history of opiate abuse or dependence.  If you are on a pain contract with your primary care doctor or a pain specialist, be sure to let them know you were prescribed this medication today from the [facility].  This medication is intended for your use only - do not give any to anyone else and keep it in a secure place where nobody else, especially children, have access to it.  It will also cause or worsen constipation, so you may want to consider taking an over-the-counter stool softener while you are taking this medication. ? ?Return to the ED if your abdominal pain worsens or fails to improve, you develop bloody vomiting, bloody diarrhea, you are  unable to tolerate fluids due to vomiting, fever greater than 101, or other symptoms that concern you. ? ?

## 2016-03-23 NOTE — ED Triage Notes (Signed)
Pt ambulatory to triage with steady gait, no distress noted. Pt c/o upper abdominal pain x2 weeks. Pt reports she has had 3 episodes where she will wake up from sleeping with upper abdominal pain and emesis.

## 2016-03-28 ENCOUNTER — Ambulatory Visit (INDEPENDENT_AMBULATORY_CARE_PROVIDER_SITE_OTHER): Payer: Medicaid Other | Admitting: Surgery

## 2016-03-28 ENCOUNTER — Encounter: Payer: Self-pay | Admitting: Surgery

## 2016-03-28 DIAGNOSIS — K8020 Calculus of gallbladder without cholecystitis without obstruction: Secondary | ICD-10-CM | POA: Diagnosis not present

## 2016-03-28 NOTE — Patient Instructions (Addendum)
We will schedule your gallbladder surgery with Dr. Aleen CampiPiscoya at [facility] on 05/01/16.  You will most likely be out of work 1-2 weeks for this surgery. You will return after your post-op appointment with a lifting restriction for approximately 4 more weeks.  You will be able to eat anything you would like to following surgery. But, start by eating a bland diet and advance this as tolerated.  Please see the (blue)pre-care form that you have been given today. If you have any questions, please call our office.  Laparoscopic Cholecystectomy Laparoscopic cholecystectomy is surgery to remove the gallbladder. The gallbladder is located in the upper right part of the abdomen, behind the liver. It is a storage sac for bile, which is produced in the liver. Bile aids in the digestion and absorption of fats. Cholecystectomy is often done for inflammation of the gallbladder (cholecystitis). This condition is usually caused by a buildup of gallstones (cholelithiasis) in the gallbladder. Gallstones can block the flow of bile, and that can result in inflammation and pain. In severe cases, emergency surgery may be required. If emergency surgery is not required, you will have time to prepare for the procedure. Laparoscopic surgery is an alternative to open surgery. Laparoscopic surgery has a shorter recovery time. Your common bile duct may also need to be examined during the procedure. If stones are found in the common bile duct, they may be removed. LET [facility] PROVIDER KNOW ABOUT:  Any allergies you have.  All medicines you are taking, including vitamins, herbs, eye drops, creams, and over-the-counter medicines.  Previous problems you or members of your family have had with the use of anesthetics.  Any blood disorders you have.  Previous surgeries you have had.    Any medical conditions you have. RISKS AND COMPLICATIONS Generally, this is a safe procedure. However, problems may occur,  including:  Infection.  Bleeding.  Allergic reactions to medicines.  Damage to other structures or organs.  A stone remaining in the common bile duct.  A bile leak from the cyst duct that is clipped when your gallbladder is removed.  The need to convert to open surgery, which requires a larger incision in the abdomen. This may be necessary if your surgeon thinks that it is not safe to continue with a laparoscopic procedure. BEFORE THE PROCEDURE  Ask your health care provider about:  Changing or stopping your regular medicines. This is especially important if you are taking diabetes medicines or blood thinners.  Taking medicines such as aspirin and ibuprofen. These medicines can thin your blood. Do not take these medicines before your procedure if your health care provider instructs you not to.  Follow instructions from your health care provider about eating or drinking restrictions.  Let your health care provider know if you develop a cold or an infection before surgery.  Plan to have someone take you home after the procedure.  Ask your health care provider how your surgical site will be marked or identified.  You may be given antibiotic medicine to help prevent infection. PROCEDURE  To reduce your risk of infection:  Your health care team will wash or sanitize their hands.  Your skin will be washed with soap.  An IV tube may be inserted into one of your veins.  You will be given a medicine to make you fall asleep (general anesthetic).  A breathing tube will be placed in your mouth.  The surgeon will make several small cuts (incisions) in your abdomen.  A  thin, lighted tube (laparoscope) that has a tiny camera on the end will be inserted through one of the small incisions. The camera on the laparoscope will send a picture to a TV screen (monitor) in the operating room. This will give the surgeon a good view inside your abdomen.  A gas will be pumped into your  abdomen. This will expand your abdomen to give the surgeon more room to perform the surgery.  Other tools that are needed for the procedure will be inserted through the other incisions. The gallbladder will be removed through one of the incisions.  After your gallbladder has been removed, the incisions will be closed with stitches (sutures), staples, or skin glue.  Your incisions may be covered with a bandage (dressing). The procedure may vary among health care providers and hospitals. AFTER THE PROCEDURE  Your blood pressure, heart rate, breathing rate, and blood oxygen level will be monitored often until the medicines you were given have worn off.  You will be given medicines as needed to control your pain.   This information is not intended to replace advice given to you by your health care provider. Make sure you discuss any questions you have with your health care provider.   Document Released: 06/05/2005 Document Revised: 02/24/2015 Document Reviewed: 01/15/2013 Elsevier Interactive Patient Education 2016 Elsevier Inc.   Low-Fat Diet for Pancreatitis or Gallbladder Conditions A low-fat diet can be helpful if you have pancreatitis or a gallbladder condition. With these conditions, your pancreas and gallbladder have trouble digesting fats. A healthy eating plan with less fat will help rest your pancreas and gallbladder and reduce your symptoms. WHAT DO I NEED TO KNOW ABOUT THIS DIET?  Eat a low-fat diet.  Reduce your fat intake to less than 20-30% of your total daily calories. This is less than 50-60 g of fat per day.  Remember that you need some fat in your diet. Ask your dietician what your daily goal should be.  Choose nonfat and low-fat healthy foods. Look for the words "nonfat," "low fat," or "fat free."  As a guide, look on the label and choose foods with less than 3 g of fat per serving. Eat only one serving.  Avoid alcohol.  Do not smoke. If you need help quitting,  talk with your health care provider.  Eat small frequent meals instead of three large heavy meals. WHAT FOODS CAN I EAT? Grains Include healthy grains and starches such as potatoes, wheat bread, fiber-rich cereal, and brown rice. Choose whole grain options whenever possible. In adults, whole grains should account for 45-65% of your daily calories.  Fruits and Vegetables Eat plenty of fruits and vegetables. Fresh fruits and vegetables add fiber to your diet. Meats and Other Protein Sources Eat lean meat such as chicken and pork. Trim any fat off of meat before cooking it. Eggs, fish, and beans are other sources of protein. In adults, these foods should account for 10-35% of your daily calories. Dairy Choose low-fat milk and dairy options. Dairy includes fat and protein, as well as calcium.  Fats and Oils Limit high-fat foods such as fried foods, sweets, baked goods, sugary drinks.  Other Creamy sauces and condiments, such as mayonnaise, can add extra fat. Think about whether or not you need to use them, or use smaller amounts or low fat options. WHAT FOODS ARE NOT RECOMMENDED?  High fat foods, such as:  [employer].  Ice cream.  [place] toast.  Sweet rolls.  Pizza.  Cheese  bread.  Foods covered with batter, butter, creamy sauces, or cheese.  Fried foods.  Sugary drinks and desserts.  Foods that cause gas or bloating   This information is not intended to replace advice given to you by your health care provider. Make sure you discuss any questions you have with your health care provider.   Document Released: 06/10/2013 Document Reviewed: 06/10/2013 Elsevier Interactive Patient Education [employer]

## 2016-03-28 NOTE — Progress Notes (Signed)
03/28/2016  Reason for Visit:  Symptomatic cholelithiasis  History of Present Illness: Raven Barker is a 20 y.o. female who presents with episodes of biliary colic. Patient presented to the emergency room on 03/23/16 with a 1 hour episode of epigastric abdominal pain. She reports that this is her third episode in approximately 5-6 weeks. This pain had been at night and woke her up from sleep. She reports having nausea and 1 episode of emesis and then presented to the emergency room. Her pain subsided at that point and was referred to the office for further evaluation. Each of these episodes has happen at nighttime in the middle of the night. She does not recall any specific fatty foods prior to each episode. Denies feeling any heartburn or pain going up to chest. Denies having any fevers, chills, chest pain, shortness of breath, any other areas of abdominal pain, diarrhea, constipation, dysuria, hematuria, or blood in the stools.  Past Medical History: Past Medical History:  Diagnosis Date  . History of anemia     Past Surgical History: None  Home Medications: Prior to Admission medications   Medication Sig Start Date End Date Taking? Authorizing Provider  medroxyPROGESTERone (DEPO-PROVERA) 150 MG/ML injection Inject 1 mL (150 mg total) into the muscle every 3 (three) months. 03/10/16  Yes Melody N Shambley, CNM    Allergies: No Known Allergies  Social History:  reports that she has never smoked. She has never used smokeless tobacco. She reports that she does not drink alcohol or use drugs.   Family History: Family History  Problem Relation Age of Onset  . Hypertension Maternal Grandfather   . Diabetes Paternal Grandmother   . Hypertension Mother had cholecystectomy Paternal Grandmother     Review of Systems: Review of Systems  Constitutional: Negative for chills and fever.  Eyes: Negative for blurred vision.  Respiratory: Negative for cough and shortness of breath.    Cardiovascular: Negative for chest pain and leg swelling.  Gastrointestinal: Positive for abdominal pain, nausea and vomiting. Negative for blood in stool, constipation, diarrhea and heartburn.  Genitourinary: Negative for dysuria and hematuria.  Musculoskeletal: Negative for myalgias.  Skin: Negative for rash.  Neurological: Negative for dizziness and headaches.  Psychiatric/Behavioral: Negative for depression.  All other systems reviewed and are negative.   Physical Exam BP 126/69   Pulse 84   Temp 98.3 F (36.8 C) (Oral)   Ht 5\' 4"  (1.626 m)   Wt 73 kg (161 lb)   LMP 03/10/2016 (Exact Date)   BMI 27.64 kg/m  CONSTITUTIONAL: No acute distress HEENT:  Normocephalic, atraumatic, extraocular motion intact. NECK: Trachea is midline, and there is no jugular venous distension.  LYMPH NODES:  Lymph nodes in the neck are not enlarged. RESPIRATORY:  Lungs are clear, and breath sounds are equal bilaterally. Normal respiratory effort without pathologic use of accessory muscles. CARDIOVASCULAR: Heart is regular without murmurs, gallops, or rubs. GI: The abdomen is soft, nondistended, nontender. Negative Murphy sign. There were no palpable masses. There was no hepatosplenomegaly. MUSCULOSKELETAL:  Normal muscle strength and tone in all four extremities.  No peripheral edema or cyanosis. SKIN: Skin turgor is normal. There are no pathologic skin lesions.  NEUROLOGIC:  Motor and sensation is grossly normal.  Cranial nerves are grossly intact. PSYCH:  Alert and oriented to person, place and time. Affect is normal.  Laboratory Analysis: No results found for this or any previous visit (from the past 24 hour(s)).  Imaging: No results found.  Assessment and Plan: This  is a 20 y.o. female who presents with symptomatic cholelithiasis. I personally reviewed all of the patient's laboratory studies from the emergency room as well as the ultrasound which showed cholelithiasis with small stones in the  gallbladder. I had a discussion with the patient regarding conservative management versus surgical management and that there is no urgency to having surgery done, and she has opted for cholecystectomy. I discussed with the patient laparoscopic versus open procedure including the risks and benefits of bleeding, infection, and injury to surrounding structures. She is willing to proceed with surgical management and we will book her for next month. I also discussed with the patient preoperative as well as postoperative instructions. She is in agreement with this plan and all of her questions were answered.   Howie IllJose Luis Sharday Michl, MD [facility]

## 2016-03-31 ENCOUNTER — Telehealth: Payer: Self-pay | Admitting: Surgery

## 2016-03-31 NOTE — Telephone Encounter (Signed)
Pt advised of pre op date/time and sx date. Sx: 05/01/16 with Dr Piscoya--laparoscopic cholecystectomy.  Pre op: 04/24/16 between 9-1:00pm--[facility].   Patient made aware to call [phone], between 1-3:00pm the day before surgery, to find out what time to arrive.

## 2016-04-10 ENCOUNTER — Emergency Department: Payer: Medicaid Other

## 2016-04-10 ENCOUNTER — Encounter: Payer: Self-pay | Admitting: Emergency Medicine

## 2016-04-10 ENCOUNTER — Emergency Department
Admission: EM | Admit: 2016-04-10 | Discharge: 2016-04-10 | Disposition: A | Discharge status: Home / Self Care | Payer: Medicaid Other | Attending: Emergency Medicine | Admitting: Emergency Medicine

## 2016-04-10 DIAGNOSIS — K8020 Calculus of gallbladder without cholecystitis without obstruction: Secondary | ICD-10-CM | POA: Diagnosis not present

## 2016-04-10 DIAGNOSIS — R109 Unspecified abdominal pain: Secondary | ICD-10-CM

## 2016-04-10 DIAGNOSIS — R1011 Right upper quadrant pain: Secondary | ICD-10-CM | POA: Diagnosis present

## 2016-04-10 LAB — COMPREHENSIVE METABOLIC PANEL
ALT: 149 U/L — ABNORMAL HIGH (ref 14–54)
AST: 168 U/L — AB (ref 15–41)
Albumin: 4.4 g/dL (ref 3.5–5.0)
Alkaline Phosphatase: 85 U/L (ref 38–126)
Anion gap: 6 (ref 5–15)
BUN: 14 mg/dL (ref 6–20)
CHLORIDE: 105 mmol/L (ref 101–111)
CO2: 26 mmol/L (ref 22–32)
Calcium: 9.2 mg/dL (ref 8.9–10.3)
Creatinine, Ser: 0.77 mg/dL (ref 0.44–1.00)
GFR calc Af Amer: >60 mL/min (ref >60)
Glucose, Bld: 140 mg/dL — ABNORMAL HIGH (ref 65–99)
POTASSIUM: 3.3 mmol/L — AB (ref 3.5–5.1)
SODIUM: 137 mmol/L (ref 135–145)
Total Bilirubin: 1.4 mg/dL — ABNORMAL HIGH (ref 0.3–1.2)
Total Protein: 8.8 g/dL — ABNORMAL HIGH (ref 6.5–8.1)

## 2016-04-10 LAB — URINALYSIS COMPLETE WITH MICROSCOPIC (ARMC ONLY)
BACTERIA UA: NONE SEEN
Bilirubin Urine: NEGATIVE
Glucose, UA: NEGATIVE mg/dL
Ketones, ur: NEGATIVE mg/dL
Nitrite: NEGATIVE
PH: 6 (ref 5.0–8.0)
PROTEIN: NEGATIVE mg/dL
Specific Gravity, Urine: 1.026 (ref 1.005–1.030)

## 2016-04-10 LAB — CBC
HEMATOCRIT: 36.3 % (ref 35.0–47.0)
Hemoglobin: 12.9 g/dL (ref 12.0–16.0)
MCH: 31 pg (ref 26.0–34.0)
MCHC: 35.5 g/dL (ref 32.0–36.0)
MCV: 87.5 fL (ref 80.0–100.0)
Platelets: 268 10*3/uL (ref 150–440)
RBC: 4.14 MIL/uL (ref 3.80–5.20)
RDW: 12.6 % (ref 11.5–14.5)
WBC: 10.8 10*3/uL (ref 3.6–11.0)

## 2016-04-10 LAB — LIPASE, BLOOD: LIPASE: 17 U/L (ref 11–51)

## 2016-04-10 LAB — POCT PREGNANCY, URINE: PREG TEST UR: NEGATIVE

## 2016-04-10 MED ORDER — ONDANSETRON 4 MG PO TBDP
4.0000 mg | ORAL_TABLET | Freq: Once | ORAL | Status: AC | PRN
  Administered 2016-04-10: 4 mg via ORAL
  Filled 2016-04-10: qty 1

## 2016-04-10 MED ORDER — OXYCODONE-ACETAMINOPHEN 5-325 MG PO TABS
1.0000 | ORAL_TABLET | ORAL | Status: DC | PRN
  Administered 2016-04-10: 1 via ORAL
  Filled 2016-04-10: qty 1

## 2016-04-10 MED ORDER — OXYCODONE-ACETAMINOPHEN 5-325 MG PO TABS: 1.0000 | ORAL_TABLET | ORAL | 0 refills | Status: DC | PRN

## 2016-04-10 NOTE — ED Provider Notes (Signed)
[facility] Emergency Department Provider Note    First MD Initiated Contact with Patient 04/10/16 [phone]     (approximate)  I have reviewed the triage vital signs and the nursing notes.   HISTORY  Chief Complaint Abdominal Pain    HPI Raven Barker is a 20 y.o. female with history of gallstones diagnosed on 03/23/2016 returns to the emergency department right upper quadrant pain that was 10 out of 10 but now completely resolved. Patient stated the pain started at 8:45 PM and was accompanied by nonbloody emesis. Patient states that she took a "pain pill at home without any improvement of pain and a such resulted in her presentation to the emergency department.   Past Medical History:  Diagnosis Date  . History of anemia   . Medical history non-contributory     Patient Active Problem List   Diagnosis Date Noted  . Cholelithiasis 03/28/2016    History reviewed. No pertinent surgical history.  Prior to Admission medications   Medication Sig Start Date End Date Taking? Authorizing Provider  medroxyPROGESTERone (DEPO-PROVERA) 150 MG/ML injection Inject 1 mL (150 mg total) into the muscle every 3 (three) months. 03/10/16   Melody Suzan Nailer, CNM  oxyCODONE-acetaminophen (ROXICET) 5-325 MG tablet Take 1 tablet by mouth every 4 (four) hours as needed for severe pain. 04/10/16   Darci Current, MD    Allergies No known drug allergies  Family History  Problem Relation Age of Onset  . Hypertension Maternal Grandfather   . Diabetes Paternal Grandmother   . Hypertension Paternal Grandmother     Social History Social History  Substance Use Topics  . Smoking status: Never Smoker  . Smokeless tobacco: Never Used  . Alcohol use No    Review of Systems Constitutional: No fever/chills Eyes: No visual changes. ENT: No sore throat. Cardiovascular: Denies chest pain. Respiratory: Denies shortness of breath. Gastrointestinal:Positive for of her  abdominal pain and vomiting No diarrhea.  No constipation. Genitourinary: Negative for dysuria. Musculoskeletal: Negative for back pain. Skin: Negative for rash. Neurological: Negative for headaches, focal weakness or numbness.  10-point ROS otherwise negative.  ____________________________________________   PHYSICAL EXAM:  VITAL SIGNS: ED Triage Vitals  Enc Vitals Group     BP 04/10/16 0031 (!) 116/59     Pulse Rate 04/10/16 0031 84     Resp 04/10/16 0031 20     Temp 04/10/16 0031 97.8 F (36.6 C)     Temp Source 04/10/16 0031 Oral     SpO2 04/10/16 0031 98 %     Weight 04/10/16 0032 160 lb (72.6 kg)     Height 04/10/16 0032 5\' 3"  (1.6 m)     Head Circumference --      Peak Flow --      Pain Score 04/10/16 0032 8     Pain Loc --      Pain Edu? --      Excl. in GC? --     Constitutional: Alert and oriented. Well appearing and in no acute distress. Eyes: Conjunctivae are normal. PERRL. EOMI. Head: Atraumatic. Mouth/Throat: Mucous membranes are moist.  Oropharynx non-erythematous. Neck: No stridor.   Cardiovascular: Normal rate, regular rhythm. Good peripheral circulation. Grossly normal heart sounds. Respiratory: Normal respiratory effort.  No retractions. Lungs CTAB. Gastrointestinal: Soft and nontender. No distention.  Musculoskeletal: No lower extremity tenderness nor edema. No gross deformities of extremities. Neurologic:  Normal speech and language. No gross focal neurologic deficits are appreciated.  Skin:  Skin  is warm, dry and intact. No rash noted. Psychiatric: Mood and affect are normal. Speech and behavior are normal.  ____________________________________________   LABS (all labs ordered are listed, but only abnormal results are displayed)  Labs Reviewed  COMPREHENSIVE METABOLIC PANEL - Abnormal; Notable for the following:       Result Value   Potassium 3.3 (*)    Glucose, Bld 140 (*)    Total Protein 8.8 (*)    AST 168 (*)    ALT 149 (*)    Total  Bilirubin 1.4 (*)    All other components within normal limits  URINALYSIS COMPLETEWITH MICROSCOPIC ([facility] ONLY) - Abnormal; Notable for the following:    Color, Urine YELLOW (*)    APPearance HAZY (*)    Hgb urine dipstick 3+ (*)    Leukocytes, UA TRACE (*)    Squamous Epithelial / LPF 6-30 (*)    All other components within normal limits  LIPASE, BLOOD  CBC  POCT PREGNANCY, URINE    RADIOLOGY I, [place] N BROWN, personally viewed and evaluated these images (plain radiographs) as part of my medical decision making, as well as reviewing the written report by the radiologist.  [place] Abdomen Limited Ruq  Result Date: 04/10/2016 CLINICAL DATA:  20 year old female with history of right upper quadrant abdominal pain radiating to the back for the last 9 hours. Elevated liver function tests. EXAM: US ABDOMEN LIMITED - RIGHT UPPER QUADRANT COMPARISON:  Abdominal ultrasound 03/23/2016. FINDINGS: Gallbladder: Multiple echogenic foci with posterior acoustic shadowing, compatible with gallstones. Gallbladder is partially contracted. No pericholecystic fluid. Gallbladder wall thickness is mildly increased at 4 mm, likely related to under distention. Per report from the sonographer, there was no sonographic Murphy's sign on examination. Common bile duct: Diameter: 2.5 mm Liver: No focal lesion identified. Within normal limits in parenchymal echogenicity. Normal hepatopetal flow in the portal vein. IMPRESSION: 1. Study is again positive for cholelithiasis, with no definite findings to suggest an acute cholecystitis at this time. Electronically Signed   By: Trudie Reedaniel  Entrikin M.D.   On: 04/10/2016 07:24     Procedures   INITIAL IMPRESSION / ASSESSMENT AND PLAN / ED COURSE  Pertinent labs & imaging results that were available during my care of the patient were reviewed by me and considered in my medical decision making (see chart for details).     Clinical Course     ____________________________________________  FINAL CLINICAL IMPRESSION(S) / ED DIAGNOSES  Final diagnoses:  Abdominal pain  Calculus of gallbladder without cholecystitis without obstruction  Gallstones     MEDICATIONS GIVEN DURING THIS VISIT:  Medications  oxyCODONE-acetaminophen (PERCOCET/ROXICET) 5-325 MG per tablet 1 tablet (1 tablet Oral Given 04/10/16 0040)  ondansetron (ZOFRAN-ODT) disintegrating tablet 4 mg (4 mg Oral Given 04/10/16 0040)     NEW OUTPATIENT MEDICATIONS STARTED DURING THIS VISIT:  New Prescriptions   OXYCODONE-ACETAMINOPHEN (ROXICET) 5-325 MG TABLET    Take 1 tablet by mouth every 4 (four) hours as needed for severe pain.    Modified Medications   No medications on file    Discontinued Medications   No medications on file     Note:  This document was prepared using Dragon voice recognition software and may include unintentional dictation errors.    Darci Currentandolph N Brown, MD 04/10/16 [phone]

## 2016-04-10 NOTE — ED Triage Notes (Signed)
Pt reports hx of gallstones and describes current pain as same. Pt states that she has pain medication at home around 2045 and it has not helped this time. Pt reports upper abdominal pain that is starting on the right side and radiating around back. Pt is ambulatory with NAD noted at this time.

## 2016-04-10 NOTE — ED Notes (Signed)
Patient transported to Ultrasound 

## 2016-04-24 ENCOUNTER — Inpatient Hospital Stay: Admission: RE | Admit: 2016-04-24 | Payer: Medicaid Other | Source: Ambulatory Visit

## 2016-04-25 ENCOUNTER — Inpatient Hospital Stay: Admission: RE | Admit: 2016-04-25 | Payer: Medicaid Other | Source: Ambulatory Visit

## 2016-04-26 ENCOUNTER — Inpatient Hospital Stay: Admission: RE | Admit: 2016-04-26 | Payer: Medicaid Other | Source: Ambulatory Visit

## 2016-04-27 ENCOUNTER — Encounter: Payer: Self-pay | Admitting: *Deleted

## 2016-04-27 NOTE — Patient Instructions (Signed)
  Your procedure is scheduled on: 05-01-16 [facility](MONDAY) Report to Same Day Surgery 2nd floor medical mall To find out your arrival time please call [phone] between 1PM - 3PM on 04-28-16 (FRIDAY)  Remember: Instructions that are not followed completely may result in serious medical risk, up to and including death, or upon the discretion of your surgeon and anesthesiologist your surgery may need to be rescheduled.    _x___ 1. Do not eat food or drink liquids after midnight. No gum chewing or hard candies.     __x__ 2. No Alcohol for 24 hours before or after surgery.   __x__3. No Smoking for 24 prior to surgery.   ____  4. Bring all medications with you on the day of surgery if instructed.    __x__ 5. Notify your doctor if there is any change in your medical condition     (cold, fever, infections).     Do not wear jewelry, make-up, hairpins, clips or nail polish.  Do not wear lotions, powders, or perfumes. You may wear deodorant.  Do not shave 48 hours prior to surgery. Men may shave face and neck.  Do not bring valuables to the hospital.    [facility] is not responsible for any belongings or valuables.               Contacts, dentures or bridgework may not be worn into surgery.  Leave your suitcase in the car. After surgery it may be brought to your room.  For patients admitted to the hospital, discharge time is determined by your treatment team.   Patients discharged the day of surgery will not be allowed to drive home.    Please read over the following fact sheets that you were given:   [facility] Preparing for Surgery and or MRSA Information   _x___ Take these medicines the morning of surgery with A SIP OF WATER:    1. NONE  2.  3.  4.  5.  6.  ____Fleets enema or Magnesium Citrate as directed.   _x___ Use CHG Soap or sage wipes as directed on instruction sheet   ____ Use inhalers on the day of surgery and bring to hospital day of surgery  ____ Stop metformin 2  days prior to surgery    ____ Take 1/2 of usual insulin dose the night before surgery and none on the morning of  surgery.   ____ Stop aspirin or coumadin, or plavix  x__ Stop Anti-inflammatories such as Advil, Aleve, Ibuprofen, Motrin, Naproxen,          Naprosyn, Goodies powders or aspirin products. Ok to take PERCOCET IF NEEDED   ____ Stop supplements until after surgery.    ____ Bring C-Pap to the hospital.

## 2016-04-28 ENCOUNTER — Encounter
Admission: RE | Admit: 2016-04-28 | Discharge: 2016-04-28 | Disposition: A | Discharge status: Home / Self Care | Payer: Medicaid Other | Source: Ambulatory Visit | Attending: Surgery | Admitting: Surgery

## 2016-04-28 DIAGNOSIS — K8070 Calculus of gallbladder and bile duct without cholecystitis without obstruction: Secondary | ICD-10-CM | POA: Diagnosis present

## 2016-04-28 LAB — COMPREHENSIVE METABOLIC PANEL
ALK PHOS: 83 U/L (ref 38–126)
ALT: 229 U/L — ABNORMAL HIGH (ref 14–54)
ANION GAP: 5 (ref 5–15)
AST: 85 U/L — ABNORMAL HIGH (ref 15–41)
Albumin: 4.3 g/dL (ref 3.5–5.0)
BILIRUBIN TOTAL: 1.3 mg/dL — AB (ref 0.3–1.2)
BUN: 10 mg/dL (ref 6–20)
CALCIUM: 9.3 mg/dL (ref 8.9–10.3)
CO2: 27 mmol/L (ref 22–32)
Chloride: 105 mmol/L (ref 101–111)
Creatinine, Ser: 0.61 mg/dL (ref 0.44–1.00)
GFR calc non Af Amer: >60 mL/min (ref >60)
Glucose, Bld: 99 mg/dL (ref 65–99)
POTASSIUM: 3.9 mmol/L (ref 3.5–5.1)
Sodium: 137 mmol/L (ref 135–145)
TOTAL PROTEIN: 8.3 g/dL — AB (ref 6.5–8.1)

## 2016-05-01 ENCOUNTER — Encounter: Payer: Self-pay | Admitting: *Deleted

## 2016-05-01 ENCOUNTER — Ambulatory Visit: Payer: Medicaid Other

## 2016-05-01 ENCOUNTER — Ambulatory Visit: Payer: Medicaid Other | Admitting: Registered Nurse

## 2016-05-01 ENCOUNTER — Encounter
Admission: RE | Disposition: A | Discharge status: Home / Self Care | Payer: Self-pay | Source: Ambulatory Visit | Attending: Surgery

## 2016-05-01 ENCOUNTER — Ambulatory Visit
Admission: RE | Admit: 2016-05-01 | Discharge: 2016-05-01 | Disposition: A | Discharge status: Home / Self Care | Payer: Medicaid Other | Source: Ambulatory Visit | Attending: Surgery | Admitting: Surgery

## 2016-05-01 DIAGNOSIS — K8020 Calculus of gallbladder without cholecystitis without obstruction: Secondary | ICD-10-CM

## 2016-05-01 DIAGNOSIS — K8070 Calculus of gallbladder and bile duct without cholecystitis without obstruction: Secondary | ICD-10-CM | POA: Diagnosis not present

## 2016-05-01 DIAGNOSIS — Z419 Encounter for procedure for purposes other than remedying health state, unspecified: Secondary | ICD-10-CM

## 2016-05-01 HISTORY — PX: CHOLECYSTECTOMY: SHX55

## 2016-05-01 HISTORY — DX: Anemia, unspecified: D64.9

## 2016-05-01 LAB — POCT PREGNANCY, URINE: PREG TEST UR: NEGATIVE

## 2016-05-01 SURGERY — LAPAROSCOPIC CHOLECYSTECTOMY WITH INTRAOPERATIVE CHOLANGIOGRAM
Anesthesia: General | Wound class: Clean Contaminated

## 2016-05-01 MED ORDER — GLUCAGON HCL RDNA (DIAGNOSTIC) 1 MG IJ SOLR
INTRAMUSCULAR | Status: AC
  Filled 2016-05-01: qty 1

## 2016-05-01 MED ORDER — ONDANSETRON HCL 4 MG/2ML IJ SOLN
INTRAMUSCULAR | Status: AC
  Filled 2016-05-01: qty 2

## 2016-05-01 MED ORDER — IOTHALAMATE MEGLUMINE 60 % INJ SOLN
INTRAMUSCULAR | Status: DC | PRN
  Administered 2016-05-01: 10 mL

## 2016-05-01 MED ORDER — KETOROLAC TROMETHAMINE 30 MG/ML IJ SOLN
INTRAMUSCULAR | Status: DC | PRN
  Administered 2016-05-01: 30 mg via INTRAVENOUS

## 2016-05-01 MED ORDER — FAMOTIDINE 20 MG PO TABS
20.0000 mg | ORAL_TABLET | Freq: Once | ORAL | Status: AC
  Administered 2016-05-01: 20 mg via ORAL

## 2016-05-01 MED ORDER — DEXAMETHASONE SODIUM PHOSPHATE 10 MG/ML IJ SOLN
INTRAMUSCULAR | Status: DC | PRN
  Administered 2016-05-01: 5 mg via INTRAVENOUS

## 2016-05-01 MED ORDER — ACETAMINOPHEN 10 MG/ML IV SOLN
INTRAVENOUS | Status: DC | PRN
  Administered 2016-05-01: 1000 mg via INTRAVENOUS

## 2016-05-01 MED ORDER — CEFAZOLIN SODIUM-DEXTROSE 2-4 GM/100ML-% IV SOLN
2.0000 g | INTRAVENOUS | Status: AC
  Administered 2016-05-01: 2 g via INTRAVENOUS

## 2016-05-01 MED ORDER — MIDAZOLAM HCL 2 MG/2ML IJ SOLN
INTRAMUSCULAR | Status: DC | PRN
  Administered 2016-05-01: 2 mg via INTRAVENOUS

## 2016-05-01 MED ORDER — ONDANSETRON HCL 4 MG/2ML IJ SOLN
INTRAMUSCULAR | Status: DC | PRN
  Administered 2016-05-01: 4 mg via INTRAVENOUS

## 2016-05-01 MED ORDER — ACETAMINOPHEN 10 MG/ML IV SOLN
INTRAVENOUS | Status: AC
Start: 2016-05-01 — End: 2016-05-01
  Filled 2016-05-01: qty 100

## 2016-05-01 MED ORDER — CHLORHEXIDINE GLUCONATE CLOTH 2 % EX PADS: 6.0000 | MEDICATED_PAD | Freq: Once | CUTANEOUS | Status: DC

## 2016-05-01 MED ORDER — IBUPROFEN 600 MG PO TABS
600.0000 mg | ORAL_TABLET | Freq: Three times a day (TID) | ORAL | 0 refills | Status: DC | PRN
Start: 2016-05-01 — End: 2017-11-29

## 2016-05-01 MED ORDER — ROCURONIUM BROMIDE 100 MG/10ML IV SOLN
INTRAVENOUS | Status: DC | PRN
  Administered 2016-05-01: 5 mg via INTRAVENOUS
  Administered 2016-05-01: 35 mg via INTRAVENOUS

## 2016-05-01 MED ORDER — LIDOCAINE HCL (CARDIAC) 20 MG/ML IV SOLN
INTRAVENOUS | Status: DC | PRN
  Administered 2016-05-01: 50 mg via INTRAVENOUS

## 2016-05-01 MED ORDER — FENTANYL CITRATE (PF) 100 MCG/2ML IJ SOLN: 25.0000 ug | INTRAMUSCULAR | Status: DC | PRN

## 2016-05-01 MED ORDER — GLYCOPYRROLATE 0.2 MG/ML IJ SOLN
INTRAMUSCULAR | Status: DC | PRN
  Administered 2016-05-01: 0.6 mg via INTRAVENOUS

## 2016-05-01 MED ORDER — LACTATED RINGERS IV SOLN
INTRAVENOUS | Status: DC
  Administered 2016-05-01: 13:00:00 via INTRAVENOUS

## 2016-05-01 MED ORDER — ONDANSETRON HCL 4 MG/2ML IJ SOLN
4.0000 mg | Freq: Once | INTRAMUSCULAR | Status: AC | PRN
  Administered 2016-05-01: 4 mg via INTRAVENOUS

## 2016-05-01 MED ORDER — NEOSTIGMINE METHYLSULFATE 10 MG/10ML IV SOLN
INTRAVENOUS | Status: DC | PRN
  Administered 2016-05-01: 4 mg via INTRAVENOUS

## 2016-05-01 MED ORDER — PROPOFOL 10 MG/ML IV BOLUS
INTRAVENOUS | Status: DC | PRN
  Administered 2016-05-01: 20 mg via INTRAVENOUS
  Administered 2016-05-01: 180 mg via INTRAVENOUS

## 2016-05-01 MED ORDER — BUPIVACAINE-EPINEPHRINE 0.25% -1:200000 IJ SOLN
INTRAMUSCULAR | Status: DC | PRN
  Administered 2016-05-01: 20 mL

## 2016-05-01 MED ORDER — OXYCODONE HCL 5 MG PO TABS
5.0000 mg | ORAL_TABLET | Freq: Four times a day (QID) | ORAL | 0 refills | Status: DC | PRN
Start: 2016-05-01 — End: 2016-07-19

## 2016-05-01 MED ORDER — FENTANYL CITRATE (PF) 100 MCG/2ML IJ SOLN
INTRAMUSCULAR | Status: DC | PRN
  Administered 2016-05-01 (×2): 50 ug via INTRAVENOUS
  Administered 2016-05-01: 100 ug via INTRAVENOUS
  Administered 2016-05-01: 50 ug via INTRAVENOUS

## 2016-05-01 SURGICAL SUPPLY — 43 items
APPLIER CLIP 5 13 M/L LIGAMAX5 (MISCELLANEOUS) ×3
BLADE SURG 15 STRL LF DISP TIS (BLADE) ×1 IMPLANT
BLADE SURG 15 STRL SS (BLADE) ×2
CANISTER SUCT 1200ML W/VALVE (MISCELLANEOUS) ×3 IMPLANT
CATH CHOLANGI 4FR 420404F (CATHETERS) ×3 IMPLANT
CHLORAPREP W/TINT 10.5 ML (MISCELLANEOUS) ×3 IMPLANT
CLIP APPLIE 5 13 M/L LIGAMAX5 (MISCELLANEOUS) ×1 IMPLANT
CONRAY 60ML FOR OR (MISCELLANEOUS) ×3 IMPLANT
DRAPE C-ARM XRAY 36X54 (DRAPES) ×3 IMPLANT
DRAPE SHEET LG 3/4 BI-LAMINATE (DRAPES) ×3 IMPLANT
ELECT REM PT RETURN 9FT ADLT (ELECTROSURGICAL) ×3
ELECTRODE REM PT RTRN 9FT ADLT (ELECTROSURGICAL) ×1 IMPLANT
ENDOPOUCH RETRIEVER 10 (MISCELLANEOUS) ×3 IMPLANT
GLOVE SURG SYN 7.0 (GLOVE) ×12 IMPLANT
GLOVE SURG SYN 7.5  E (GLOVE) ×8
GLOVE SURG SYN 7.5 E (GLOVE) ×4 IMPLANT
GOWN STRL REUS W/ TWL LRG LVL3 (GOWN DISPOSABLE) ×4 IMPLANT
GOWN STRL REUS W/TWL LRG LVL3 (GOWN DISPOSABLE) ×8
IRRIGATION STRYKERFLOW (MISCELLANEOUS) ×1 IMPLANT
IRRIGATOR STRYKERFLOW (MISCELLANEOUS) ×3
IV CATH ANGIO 12GX3 LT BLUE (NEEDLE) ×6 IMPLANT
IV NS 1000ML (IV SOLUTION)
IV NS 1000ML BAXH (IV SOLUTION) IMPLANT
JACKSON PRATT 10 (INSTRUMENTS) IMPLANT
L-HOOK LAP DISP 36CM (ELECTROSURGICAL) ×3
LABEL OR SOLS (LABEL) ×3 IMPLANT
LHOOK LAP DISP 36CM (ELECTROSURGICAL) ×1 IMPLANT
LIQUID BAND (GAUZE/BANDAGES/DRESSINGS) ×3 IMPLANT
NDL HPO THNWL 1X22GA REG BVL (NEEDLE) ×1 IMPLANT
NDL SAFETY 22GX1.5 (NEEDLE) ×3 IMPLANT
NEEDLE SAFETY 22GX1 (NEEDLE) ×2
PACK LAP CHOLECYSTECTOMY (MISCELLANEOUS) ×3 IMPLANT
PENCIL ELECTRO HAND CTR (MISCELLANEOUS) ×3 IMPLANT
SCISSORS METZENBAUM CVD 33 (INSTRUMENTS) ×3 IMPLANT
SLEEVE ADV FIXATION 5X100MM (TROCAR) ×9 IMPLANT
SPONGE EXCIL AMD DRAIN 4X4 6P (MISCELLANEOUS) IMPLANT
SUT MNCRL 4-0 (SUTURE) ×2
SUT MNCRL 4-0 27XMFL (SUTURE) ×1
SUT VICRYL 0 AB UR-6 (SUTURE) ×3 IMPLANT
SUTURE MNCRL 4-0 27XMF (SUTURE) ×1 IMPLANT
TROCAR 130MM GELPORT  DAV (MISCELLANEOUS) ×3 IMPLANT
TROCAR Z-THREAD OPTICAL 5X100M (TROCAR) ×3 IMPLANT
TUBING INSUFFLATOR HI FLOW (MISCELLANEOUS) ×3 IMPLANT

## 2016-05-01 NOTE — H&P (Signed)
05/01/2016  Reason for Visit:  Biliary colic  History of Present Illness: Raven Barker is a 20 y.o. female who presents with multiple episodes of biliary colic.  She was seen in office on 10/10 and was scheduled for surgery today 11/13.  She had been to ED on 10/23 for another episode but her symptoms resolved on their own.  She now presents for surgery.  Her LFTs preoperatively have been mildly elevated and thus a cholangiogram will also be performed today.  Past Medical History: Past Medical History:  Diagnosis Date  . Anemia   . History of anemia   . Medical history non-contributory      Past Surgical History: History reviewed. No pertinent surgical history.  Home Medications: Prior to Admission medications   Medication Sig Start Date End Date Taking? Authorizing Provider  ondansetron (ZOFRAN) 4 MG tablet Take 4 mg by mouth every 8 (eight) hours as needed for nausea or vomiting.   Yes Historical Provider, MD  oxyCODONE-acetaminophen (ROXICET) 5-325 MG tablet Take 1 tablet by mouth every 4 (four) hours as needed for severe pain. 04/10/16  Yes Darci Currentandolph N Brown, MD  medroxyPROGESTERone (DEPO-PROVERA) 150 MG/ML injection Inject 1 mL (150 mg total) into the muscle every 3 (three) months. 03/10/16   Melody Suzan NailerN Shambley, CNM    Allergies: No Known Allergies  Social History:  reports that she has never smoked. She has never used smokeless tobacco. She reports that she does not drink alcohol or use drugs.   Family History: Family History  Problem Relation Age of Onset  . Hypertension Maternal Grandfather   . Diabetes Paternal Grandmother   . Hypertension Paternal Grandmother     Review of Systems: Review of Systems  Constitutional: Negative for chills and fever.  HENT: Negative for hearing loss.   Eyes: Negative for blurred vision.  Respiratory: Negative for cough and shortness of breath.   Cardiovascular: Negative for chest pain and leg swelling.  Gastrointestinal: Positive  for abdominal pain. Negative for constipation, diarrhea, heartburn, nausea and vomiting.  Genitourinary: Negative for dysuria and hematuria.  Musculoskeletal: Negative for myalgias.  Skin: Negative for rash.  Neurological: Negative for dizziness.  Psychiatric/Behavioral: Negative for depression.    Physical Exam BP 113/85   Pulse 75   Temp 98.2 F (36.8 C) (Oral)   Resp 16   Ht 5\' 3"  (1.6 m)   Wt 72.6 kg (160 lb)   LMP 04/08/2016   SpO2 100%   BMI 28.34 kg/m  CONSTITUTIONAL: No acute distress HEENT:  Normocephalic, atraumatic, extraocular motion intact. NECK: Trachea is midline, and there is no jugular venous distension. RESPIRATORY:  Lungs are clear, and breath sounds are equal bilaterally. Normal respiratory effort without pathologic use of accessory muscles. CARDIOVASCULAR: Heart is regular without murmurs, gallops, or rubs. GI: The abdomen is soft, nondistended, nontender. There were no palpable masses. There was no hepatosplenomegaly. MUSCULOSKELETAL:  Normal muscle strength and tone in all four extremities.  No peripheral edema or cyanosis. SKIN: Skin turgor is normal. There are no pathologic skin lesions.  NEUROLOGIC:  Motor and sensation is grossly normal.  Cranial nerves are grossly intact. PSYCH:  Alert and oriented to person, place and time. Affect is normal.  Laboratory Analysis: Results for orders placed or performed during the hospital encounter of 05/01/16 (from the past 24 hour(s))  Pregnancy, urine POC     Status: None   Collection Time: 05/01/16 12:20 PM  Result Value Ref Range   Preg Test, Ur NEGATIVE NEGATIVE  Imaging: No results found.  Assessment and Plan: This is a 20 y.o. female who presents with biliary colic.  She will be taken to OR today for laparoscopic cholecystectomy with cholangiogram.  Risks and benefits of the procedure were explained to the patient and she is willing to proceed.   Howie IllJose Luis Kattie Santoyo, MD [facility]

## 2016-05-01 NOTE — Anesthesia Procedure Notes (Signed)
Procedures

## 2016-05-01 NOTE — Anesthesia Procedure Notes (Signed)
Procedure Name: Intubation Performed by: Mathews ArgyleLOGAN, Kelcey Korus Pre-anesthesia Checklist: Patient identified, Patient being monitored, Timeout performed, Emergency Drugs available and Suction available Patient Re-evaluated:Patient Re-evaluated prior to inductionOxygen Delivery Method: Circle system utilized Preoxygenation: Pre-oxygenation with 100% oxygen Intubation Type: IV induction Ventilation: Mask ventilation without difficulty Laryngoscope Size: 2 and Miller Grade View: Grade I Tube type: Oral Tube size: 7.0 mm Number of attempts: 1 Placement Confirmation: ETT inserted through vocal cords under direct vision,  positive ETCO2 and breath sounds checked- equal and bilateral Secured at: 21 cm Tube secured with: Tape Dental Injury: Teeth and Oropharynx as per pre-operative assessment

## 2016-05-01 NOTE — Discharge Instructions (Signed)
AMBULATORY SURGERY  °DISCHARGE INSTRUCTIONS ° ° °1) The drugs that you were given will stay in your system until tomorrow so for the next 24 hours you should not: ° °A) Drive an automobile °B) Make any legal decisions °C) Drink any alcoholic beverage ° ° °2) You may resume regular meals tomorrow.  Today it is better to start with liquids and gradually work up to solid foods. ° °You may eat anything you prefer, but it is better to start with liquids, then soup and crackers, and gradually work up to solid foods. ° ° °3) Please notify your doctor immediately if you have any unusual bleeding, trouble breathing, redness and pain at the surgery site, drainage, fever, or pain not relieved by medication. ° ° ° °4) Additional Instructions: ° ° ° ° ° ° ° °Please contact your physician with any problems or Same Day Surgery at [phone], Monday through Friday 6 am to 4 pm, or [place] [facility] [place] Main number at [phone]. °

## 2016-05-01 NOTE — H&P (Signed)
Preoperative Review   Patient was met in the preoperative holding area. The history is reviewed in the chart and with the patient. I personally reviewed the options and rationale as well as the risks of this procedure that have been previously discussed with the patient. All questions asked by the patient and/or family were answered to their satisfaction.  Patient agrees to proceed with Laparoscopic Cholecystectomy with cholangiogram at this time.  Melvyn Neth, MD

## 2016-05-01 NOTE — Op Note (Signed)
Procedure Date:  05/01/2016  Pre-operative Diagnosis:  Symptomatic Cholelithiasis  Post-operative Diagnosis: Symptomatic Cholelithiasis  Procedure:  Laparoscopic cholecystectomy with intraoperative cholangiogram  Surgeon:  Howie IllJose Luis Savonna Birchmeier, MD  Anesthesia:  General endotracheal  Estimated Blood Loss:  5 ml  Specimens:  gallbladder  Complications:  None  Indications for Procedure:  This is a 20 y.o. female who presents with abdominal pain and workup revealing symptomatic cholelithiasis.  On preoperative work-up, she was noted to have mildly elevated LFTs and thus a cholangiogram will be performed as well. The benefits, complications, treatment options, and expected outcomes were discussed with the patient. The risks of bleeding, infection, recurrence of symptoms, failure to resolve symptoms, bile duct damage, bile duct leak, retained common bile duct stone, bowel injury, and need for further procedures were all discussed with the patient and she was willing to proceed.  Description of Procedure: The patient was correctly identified in the preoperative area and brought into the operating room.  The patient was placed supine with VTE prophylaxis in place.  Appropriate time-outs were performed.  Anesthesia was induced and the patient was intubated.  Appropriate antibiotics were infused.  The abdomen was prepped and draped in a sterile fashion. An infraumbilical incision was made. A cutdown technique was used to enter the abdominal cavity without injury, and a Hasson trocar was inserted.  Pneumoperitoneum was obtained with appropriate opening pressures.  A 5-mm port was placed in the subxiphoid area and two 5-mm ports were placed in the right upper quadrant under direct visualization.  The gallbladder was identified.  The fundus was grasped and retracted cephalad.  Adhesions were lysed bluntly and with electrocautery. The infundibulum was grasped and retracted laterally, exposing the  peritoneum overlying the gallbladder.  This was incised with electrocautery and extended on either side of the gallbladder.  The cystic duct and cystic artery were clearly identified and bluntly dissected.  The cystic duct was clipped at the junction with the gallbladder and a ductotomy was created.  A 14Fr angiocath was placed in the right upper quadrant and the cholangiogram catheter passed through it.  Using Maryland forceps, the catheter was introduced into the ductotomy and secured with a clip.  The C-arm was then brought into the field and a cholangiogram was performed showing patent right and left hepatic ducts and common bile duct with appropriate tapering, with contrast going into the duodenum and without any filling defects.   After completion of the cholangiogram, the catheter was removed and the cystic duct was clipped twice proximally and cut in between.  The cystic artery was clipped and cut in same fashion.  The gallbladder was taken from the gallbladder fossa in a retrograde fashion with electrocautery. The gallbladder was placed in an Endocatch bag and brought out via the umbilical incision. The liver bed was inspected and any bleeding was controlled with electrocautery. The right upper quadrant was then inspected again revealing intact clips, no bleeding, and no ductal injury.  The area was thoroughly irrigated.  The 5 mm ports were removed under direct visualization and the Hasson trocar was removed.  The fascial opening was closed using 0 vicryl suture.  Local anesthetic was infused in all incisions and the incisions were closed with 4-0 Monocryl.  The wounds were cleaned and sealed with DermaBond.  The patient was emerged from anesthesia and extubated and brought to the recovery room for further management.  The patient tolerated the procedure well and all counts were correct at the end of the case.  Melvyn Neth, MD

## 2016-05-01 NOTE — Interval H&P Note (Signed)
History and Physical Interval Note:  05/01/2016 1:01 PM  Raven Barker  has presented today for surgery, with the diagnosis of symptomatic cholelithiasis  The various methods of treatment have been discussed with the patient and family. After consideration of risks, benefits and other options for treatment, the patient has consented to  Procedure(s): LAPAROSCOPIC CHOLECYSTECTOMY WITH INTRAOPERATIVE CHOLANGIOGRAM (N/A) as a surgical intervention .  The patient's history has been reviewed, patient examined, no change in status, stable for surgery.  I have reviewed the patient's chart and labs.  Questions were answered to the patient's satisfaction.     Mayre Bury

## 2016-05-01 NOTE — Anesthesia Preprocedure Evaluation (Signed)
Anesthesia Evaluation  Patient identified by MRN, date of birth, ID band Patient awake    Reviewed: Allergy & Precautions, NPO status , Patient's Chart, lab work & pertinent test results, reviewed documented beta blocker date and time   Airway Mallampati: II  TM Distance: >3 FB     Dental  (+) Chipped   Pulmonary           Cardiovascular      Neuro/Psych    GI/Hepatic   Endo/Other    Renal/GU      Musculoskeletal   Abdominal   Peds  Hematology  (+) anemia ,   Anesthesia Other Findings   Reproductive/Obstetrics                             Anesthesia Physical Anesthesia Plan  ASA: II  Anesthesia Plan: General   Post-op Pain Management:    Induction: Intravenous  Airway Management Planned: Oral ETT  Additional Equipment:   Intra-op Plan:   Post-operative Plan:   Informed Consent: I have reviewed the patients History and Physical, chart, labs and discussed the procedure including the risks, benefits and alternatives for the proposed anesthesia with the patient or authorized representative who has indicated his/her understanding and acceptance.     Plan Discussed with: CRNA  Anesthesia Plan Comments:         Anesthesia Quick Evaluation  

## 2016-05-01 NOTE — Transfer of Care (Signed)
Immediate Anesthesia Transfer of Care Note  Patient: Raven OmanSintia Barker  Procedure(s) Performed: Procedure(s): LAPAROSCOPIC CHOLECYSTECTOMY WITH INTRAOPERATIVE CHOLANGIOGRAM (N/A)  Patient Location: PACU  Anesthesia Type:General  Level of Consciousness: sedated  Airway & Oxygen Therapy: Patient Spontanous Breathing and Patient connected to face mask oxygen  Post-op Assessment: Report given to RN and Post -op Vital signs reviewed and stable  Post vital signs: Reviewed  Last Vitals:  Vitals:   05/01/16 1205 05/01/16 1454  BP: 113/85 127/70  Pulse: 75 (!) 107  Resp: 16 18  Temp: 36.8 C 36.7 C    Last Pain:  Vitals:   05/01/16 1205  TempSrc: Oral         Complications: No apparent anesthesia complications

## 2016-05-02 ENCOUNTER — Encounter: Payer: Self-pay | Admitting: Surgery

## 2016-05-02 NOTE — Anesthesia Postprocedure Evaluation (Signed)
Anesthesia Post Note  Patient: Raven Barker  Procedure(s) Performed: Procedure(s) (LRB): LAPAROSCOPIC CHOLECYSTECTOMY WITH INTRAOPERATIVE CHOLANGIOGRAM (N/A)  Patient location during evaluation: PACU Anesthesia Type: General Level of consciousness: awake and alert Pain management: pain level controlled Vital Signs Assessment: post-procedure vital signs reviewed and stable Respiratory status: spontaneous breathing, nonlabored ventilation, respiratory function stable and patient connected to nasal cannula oxygen Cardiovascular status: blood pressure returned to baseline and stable Postop Assessment: no signs of nausea or vomiting Anesthetic complications: no    Last Vitals:  Vitals:   05/01/16 1606 05/01/16 1630  BP: (!) 126/59 117/65  Pulse: 96 (!) 103  Resp: 16   Temp: 36.6 C     Last Pain:  Vitals:   05/02/16 0930  TempSrc:   PainSc: 1                  Hartman Minahan S

## 2016-05-03 LAB — SURGICAL PATHOLOGY

## 2016-05-15 ENCOUNTER — Ambulatory Visit (INDEPENDENT_AMBULATORY_CARE_PROVIDER_SITE_OTHER): Payer: Medicaid Other | Admitting: Surgery

## 2016-05-15 ENCOUNTER — Encounter: Payer: Self-pay | Admitting: Surgery

## 2016-05-15 VITALS — BP 119/68 | HR 75 | Temp 97.7°F | Ht 64.0 in | Wt 157.0 lb

## 2016-05-15 DIAGNOSIS — K8020 Calculus of gallbladder without cholecystitis without obstruction: Secondary | ICD-10-CM

## 2016-05-15 NOTE — Progress Notes (Signed)
Outpatient Surgical Follow Up  05/15/2016  Raven Barker Barker is an 20 y.o. female seen for the diagnosis of Symptomatic cholelithiasis [K80.20].  HPI: Patient seen and examined in clinic. She had a laparoscopic cholecystectomy with Dr. Aleen CampiPiscoya on 11/13. Overall she is doing well with no acute complaints. she denies N/V, D/C, fevers or malaise. She states that her appetite and energy are coming back and she's currently not having any pain.  Past Medical History:  Diagnosis Date  . Anemia   . History of anemia   . Medical history non-contributory     Past Surgical History:  Procedure Laterality Date  . CHOLECYSTECTOMY N/A 05/01/2016   Procedure: LAPAROSCOPIC CHOLECYSTECTOMY WITH INTRAOPERATIVE CHOLANGIOGRAM;  Surgeon: Henrene DodgeJose Piscoya, MD;  Location: [facility] ORS;  Service: General;  Laterality: N/A;    Family History  Problem Relation Age of Onset  . Hypertension Maternal Grandfather   . Diabetes Paternal Grandmother   . Hypertension Paternal Grandmother     Social History:  reports that she has never smoked. She has never used smokeless tobacco. She reports that she does not drink alcohol or use drugs.  Allergies: No Known Allergies  Medications reviewed.  Physical Exam:  BP 119/68   Pulse 75   Temp 97.7 F (36.5 C) (Oral)   Ht 5\' 4"  (1.626 m)   Wt 157 lb (71.2 kg)   BMI 26.95 kg/m   Gen: patient resting comfortably in clinic, no cardiovascular or respiratory distress Abd/GI: Soft appropriately tender incisions are clean dry and Intact with sterile glue in place no erythema or drainage.  No results found for this or any previous visit (from the past 48 hour(s)). No results found.  Assessment/Plan: Raven Barker [name] is an 20 y.o. female seen for the diagnosis of Symptomatic cholelithiasis [K80.20]. Progressing as expected. Discussed her pathology results of colon the cholelithiasis and cholecystitis without any evidence of malignancy. The patient can lift her baby at this time and  can the base. She can call us with any questions or concerns.  Catherine L. Loflin MD General Surgeon  05/15/2016,12:04 PM

## 2016-05-15 NOTE — Patient Instructions (Signed)
Please call our office if you have questions or concerns.   

## 2016-06-20 ENCOUNTER — Encounter: Payer: Medicaid Other | Admitting: Obstetrics and Gynecology

## 2016-07-19 ENCOUNTER — Ambulatory Visit (INDEPENDENT_AMBULATORY_CARE_PROVIDER_SITE_OTHER): Payer: Medicaid Other | Admitting: Obstetrics and Gynecology

## 2016-07-19 ENCOUNTER — Encounter: Payer: Self-pay | Admitting: Obstetrics and Gynecology

## 2016-07-19 ENCOUNTER — Other Ambulatory Visit: Payer: Self-pay | Admitting: Obstetrics and Gynecology

## 2016-07-19 VITALS — BP 126/72 | HR 84 | Ht 66.0 in | Wt 156.6 lb

## 2016-07-19 DIAGNOSIS — Z01419 Encounter for gynecological examination (general) (routine) without abnormal findings: Secondary | ICD-10-CM

## 2016-07-19 DIAGNOSIS — Z0000 Encounter for general adult medical examination without abnormal findings: Secondary | ICD-10-CM

## 2016-07-19 MED ORDER — TRIPLE ANTIBIOTIC 5-400-5000 EX OINT
TOPICAL_OINTMENT | Freq: Four times a day (QID) | CUTANEOUS | 0 refills | Status: DC
Start: 2016-07-19 — End: 2016-08-16

## 2016-07-19 MED ORDER — NORGESTIMATE-ETH ESTRADIOL 0.25-35 MG-MCG PO TABS: 1.0000 | ORAL_TABLET | Freq: Every day | ORAL | 11 refills | Status: DC

## 2016-07-19 NOTE — Progress Notes (Signed)
   Subjective:     Raven Barker is a 21 y.o. female and is here for a comprehensive physical exam. The patient reports no problems.  Social History   Social History  . Marital status: Single    Spouse name: N/A  . Number of children: N/A  . Years of education: N/A   Occupational History  . Not on file.   Social History Main Topics  . Smoking status: Never Smoker  . Smokeless tobacco: Never Used  . Alcohol use No  . Drug use: No  . Sexual activity: Yes    Partners: Male   Other Topics Concern  . Not on file   Social History Narrative  . No narrative on file   Health Maintenance  Topic Date Due  . TETANUS/TDAP  08/03/2013  . PAP SMEAR  08/04/2015  . INFLUENZA VACCINE  01/18/2016  . CHLAMYDIA SCREENING  01/03/2017  . HIV Screening  Completed    The following portions of the patient's history were reviewed and updated as appropriate: allergies, current medications, past family history, past medical history, past social history, past surgical history and problem list.  Review of Systems A comprehensive review of systems was negative.   Objective:    General appearance: alert, cooperative and appears stated age Neck: no adenopathy, no carotid bruit, no JVD, supple, symmetrical, trachea midline and thyroid not enlarged, symmetric, no tenderness/mass/nodules Lungs: clear to auscultation bilaterally Breasts: normal appearance, no masses or tenderness Heart: regular rate and rhythm, S1, S2 normal, no murmur, click, rub or gallop Abdomen: soft, non-tender; bowel sounds normal; no masses,  no organomegaly Pelvic: cervix normal in appearance, external genitalia normal, no adnexal masses or tenderness, no cervical motion tenderness, rectovaginal septum normal, uterus normal size, shape, and consistency and vagina normal without discharge    Assessment:    Healthy female exam. Sty under right eye, new start OCPs     Plan:  Pap obtained sprintec-to start on 4th day of  next menses RTC 1 year or as needed.  Raven Barker, CNM   See After Visit Summary for Counseling Recommendations

## 2016-07-19 NOTE — Patient Instructions (Signed)
 Preventive Care 18-39 Years, Female Preventive care refers to lifestyle choices and visits with your health care provider that can promote health and wellness. What does preventive care include?  A yearly physical exam. This is also called an annual well check.  Dental exams once or twice a year.  Routine eye exams. Ask your health care provider how often you should have your eyes checked.  Personal lifestyle choices, including:  Daily care of your teeth and gums.  Regular physical activity.  Eating a healthy diet.  Avoiding tobacco and drug use.  Limiting alcohol use.  Practicing safe sex.  Taking vitamin and mineral supplements as recommended by your health care provider. What happens during an annual well check? The services and screenings done by your health care provider during your annual well check will depend on your age, overall health, lifestyle risk factors, and family history of disease. Counseling  Your health care provider may ask you questions about your:  Alcohol use.  Tobacco use.  Drug use.  Emotional well-being.  Home and relationship well-being.  Sexual activity.  Eating habits.  Work and work environment.  Method of birth control.  Menstrual cycle.  Pregnancy history. Screening  You may have the following tests or measurements:  Height, weight, and BMI.  Diabetes screening. This is done by checking your blood sugar (glucose) after you have not eaten for a while (fasting).  Blood pressure.  Lipid and cholesterol levels. These may be checked every 5 years starting at age 20.  Skin check.  Hepatitis C blood test.  Hepatitis B blood test.  Sexually transmitted disease (STD) testing.  BRCA-related cancer screening. This may be done if you have a family history of breast, ovarian, tubal, or peritoneal cancers.  Pelvic exam and Pap test. This may be done every 3 years starting at age 21. Starting at age 30, this may be done  every 5 years if you have a Pap test in combination with an HPV test. Discuss your test results, treatment options, and if necessary, the need for more tests with your health care provider. Vaccines  Your health care provider may recommend certain vaccines, such as:  Influenza vaccine. This is recommended every year.  Tetanus, diphtheria, and acellular pertussis (Tdap, Td) vaccine. You may need a Td booster every 10 years.  Varicella vaccine. You may need this if you have not been vaccinated.  HPV vaccine. If you are 26 or younger, you may need three doses over 6 months.  Measles, mumps, and rubella (MMR) vaccine. You may need at least one dose of MMR. You may also need a second dose.  Pneumococcal 13-valent conjugate (PCV13) vaccine. You may need this if you have certain conditions and were not previously vaccinated.  Pneumococcal polysaccharide (PPSV23) vaccine. You may need one or two doses if you smoke cigarettes or if you have certain conditions.  Meningococcal vaccine. One dose is recommended if you are age 19-21 years and a first-year college student living in a residence hall, or if you have one of several medical conditions. You may also need additional booster doses.  Hepatitis A vaccine. You may need this if you have certain conditions or if you travel or work in places where you may be exposed to hepatitis A.  Hepatitis B vaccine. You may need this if you have certain conditions or if you travel or work in places where you may be exposed to hepatitis B.  Haemophilus influenzae type b (Hib) vaccine. You may need   this if you have certain risk factors. Talk to your health care provider about which screenings and vaccines you need and how often you need them. This information is not intended to replace advice given to you by your health care provider. Make sure you discuss any questions you have with your health care provider. Document Released: 08/01/2001 Document Revised:  02/23/2016 Document Reviewed: 04/06/2015 Elsevier Interactive Patient Education  2017 [employer]  

## 2016-07-21 LAB — CYTOLOGY - PAP

## 2016-08-16 ENCOUNTER — Telehealth: Payer: Self-pay | Admitting: Obstetrics and Gynecology

## 2016-08-16 ENCOUNTER — Other Ambulatory Visit: Payer: Self-pay | Admitting: *Deleted

## 2016-08-16 MED ORDER — TRIPLE ANTIBIOTIC 5-400-5000 EX OINT: TOPICAL_OINTMENT | Freq: Four times a day (QID) | CUTANEOUS | 0 refills | Status: DC

## 2016-08-16 MED ORDER — NORGESTIMATE-ETH ESTRADIOL 0.25-35 MG-MCG PO TABS: 1.0000 | ORAL_TABLET | Freq: Every day | ORAL | 11 refills | Status: DC

## 2016-08-16 NOTE — Telephone Encounter (Signed)
PT CALLED AND SHE SAID HER BC AND EYE CREAM THE [facility] PRESCRIBED IS NOT THERE.

## 2016-08-16 NOTE — Telephone Encounter (Signed)
Sent pts medication to [facility] [address]

## 2016-12-24 IMAGING — US US ABDOMEN LIMITED
1 series · 14 of 25 positions shown · non-contrast
Comparison: None.

CLINICAL DATA: Right upper quadrant and epigastric pain with nausea
and vomiting for 2 weeks.

EXAM:
US ABDOMEN LIMITED - RIGHT UPPER QUADRANT

[Series 1: us abdomen limited · 0.22mm/px · 14 of 42 slices shown]
[im 1/42]
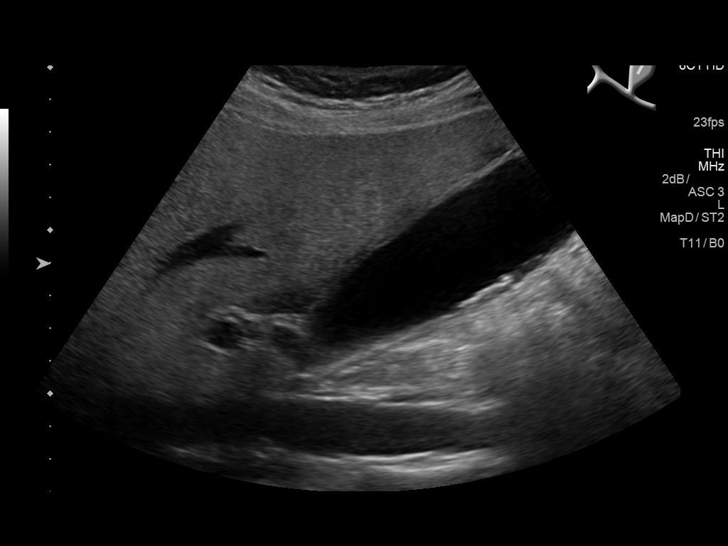
[im 4/42]
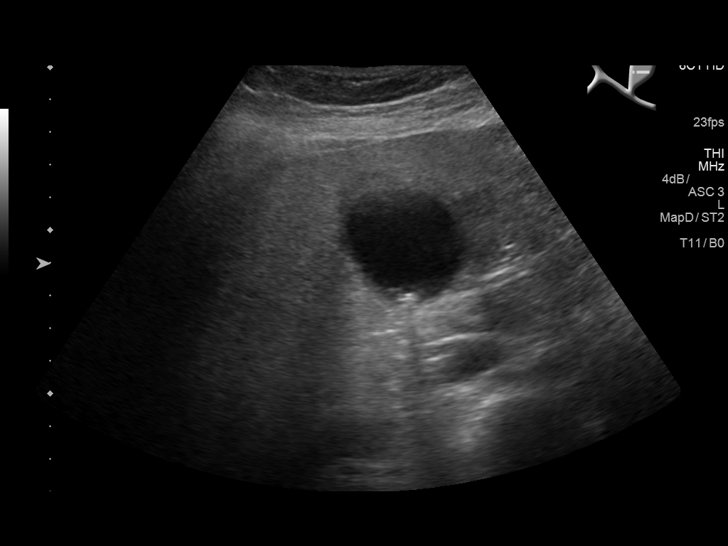
[im 7/42]
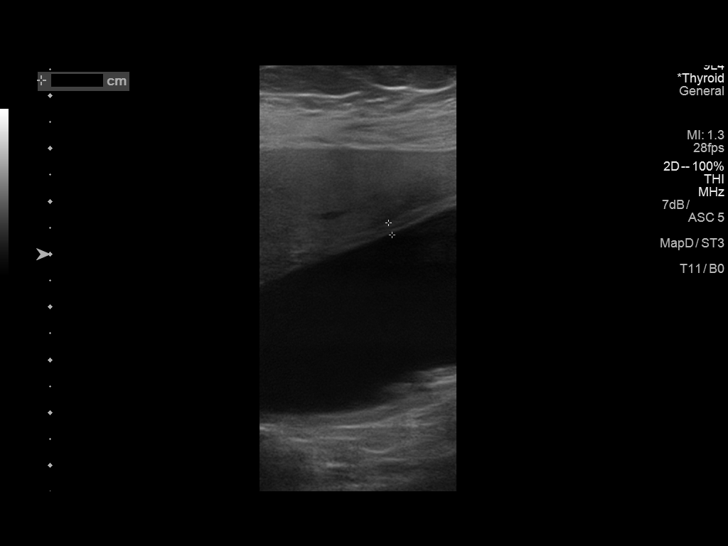
[im 11/42]
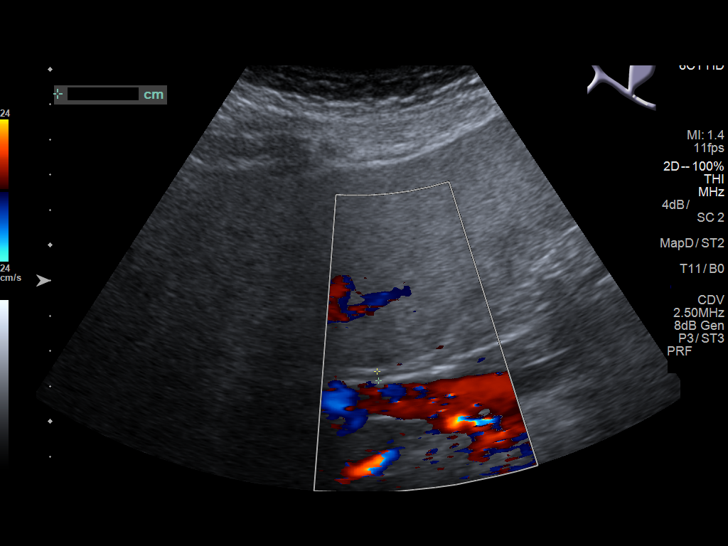
[im 14/42]
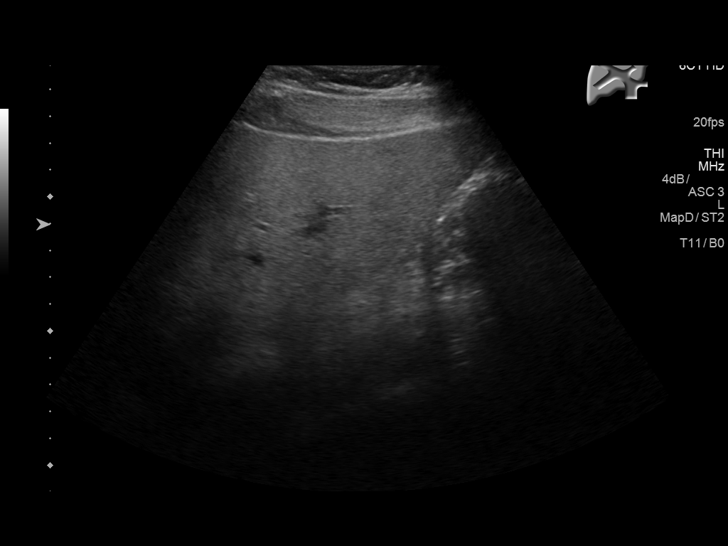
[im 16/42]
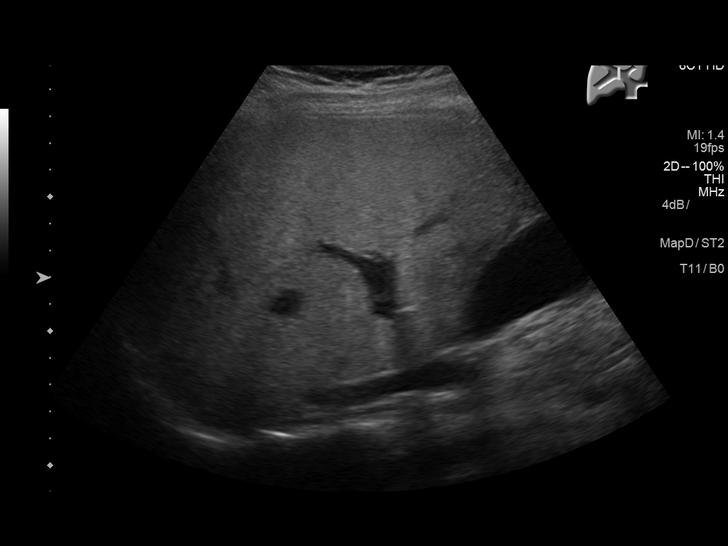
[im 19/42]
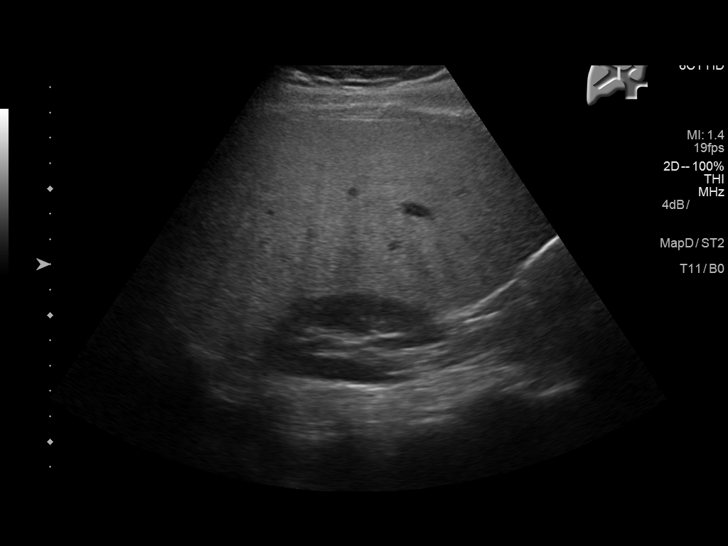
[im 23/42]
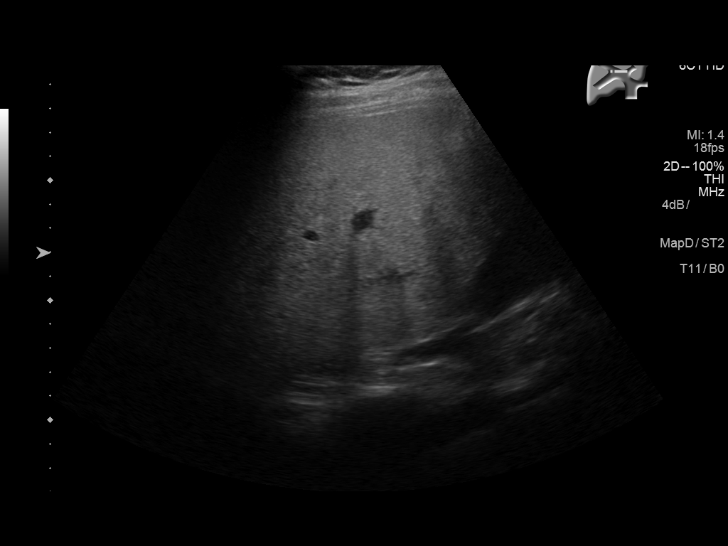
[im 26/42]
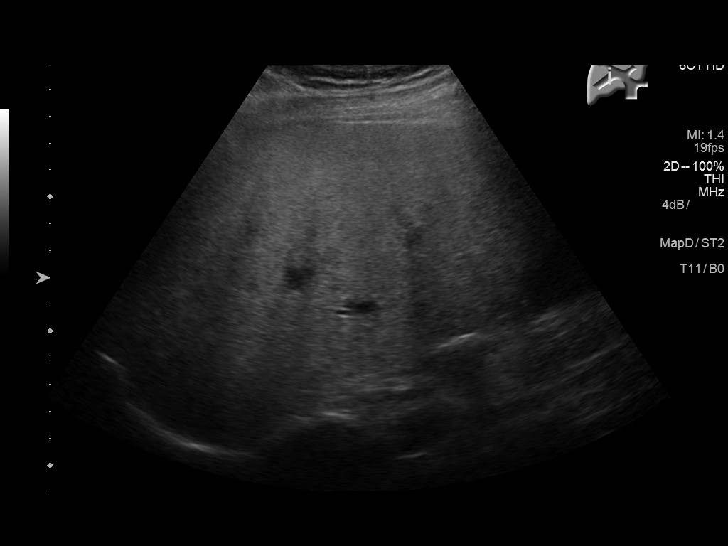
[im 28/42]
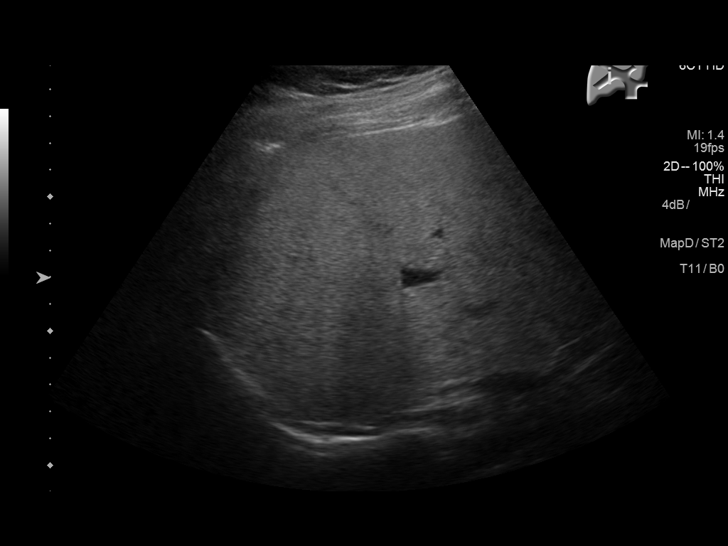
[im 31/42]
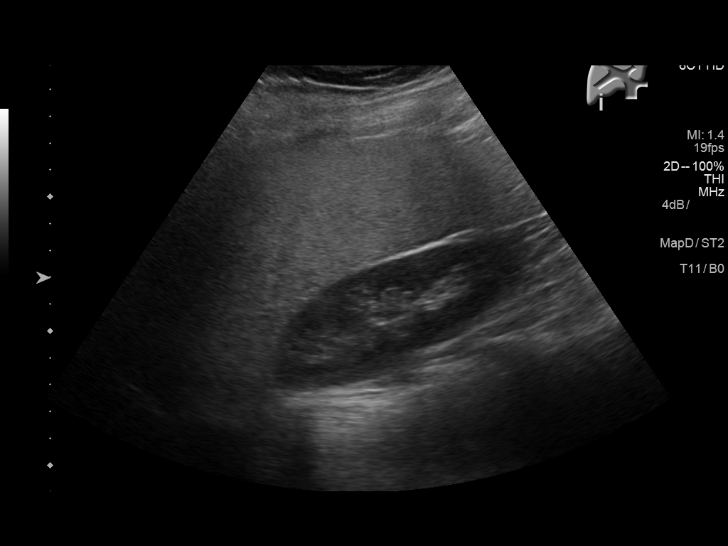
[im 35/42]
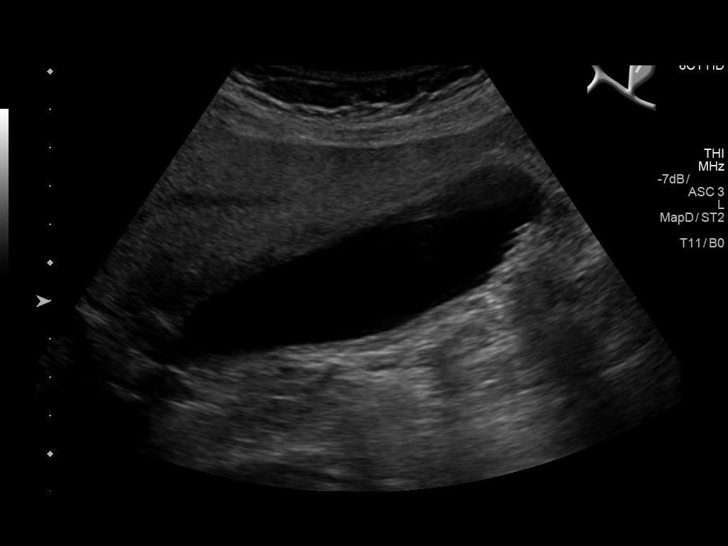
[im 38/42]
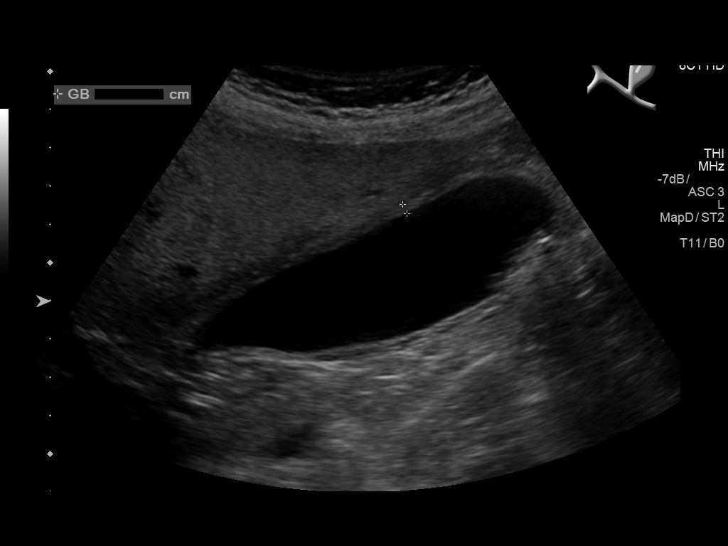
[im 42/42]
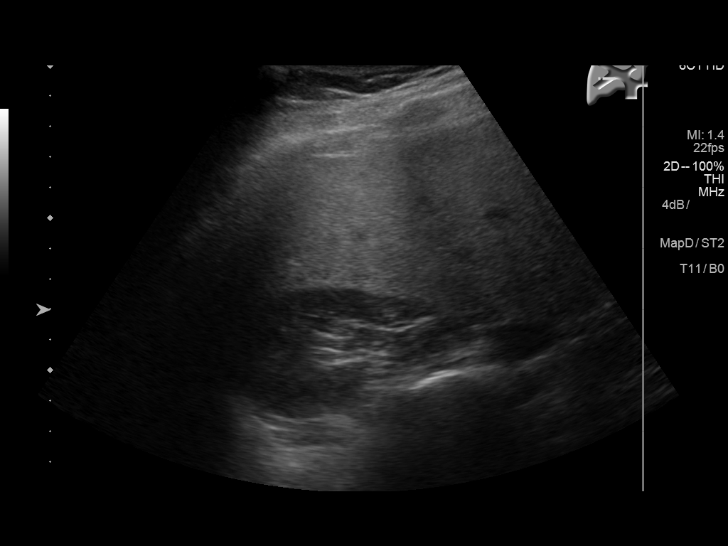

[14 of 25 positions shown; findings below may reference images not displayed]

FINDINGS: Gallbladder:

Multiple small mobile stones in the dependent portion of the
gallbladder. Largest measures about 5 mm. No gallbladder wall
thickening or edema. Murphy's sign is negative.

Common bile duct:

Diameter: 2.8 mm, normal

Liver:

Diffusely increased hepatic parenchymal echotexture suggesting fatty
infiltration. No focal lesions identified.
IMPRESSION: Cholelithiasis with small stones in the gallbladder. No additional
findings to suggest cholecystitis.

## 2017-01-11 IMAGING — US US ABDOMEN LIMITED
1 series · 14 of 25 positions shown · non-contrast
Comparison: Abdominal ultrasound 03/23/2016.

CLINICAL DATA: 20-year-old female with history of right upper
quadrant abdominal pain radiating to the back for the last 9 hours.
Elevated liver function tests.

EXAM:
US ABDOMEN LIMITED - RIGHT UPPER QUADRANT

[Series 1: us abdomen limited · 0.21mm/px · 14 of 83 slices shown]
[im 1/83]
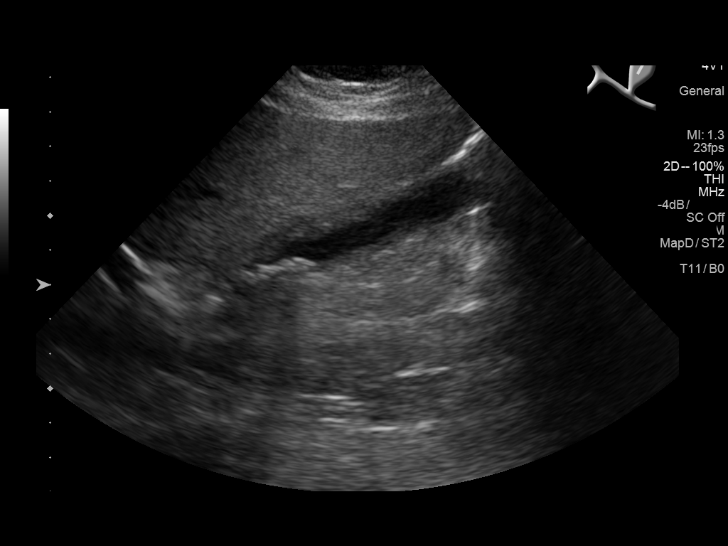
[im 7/83]
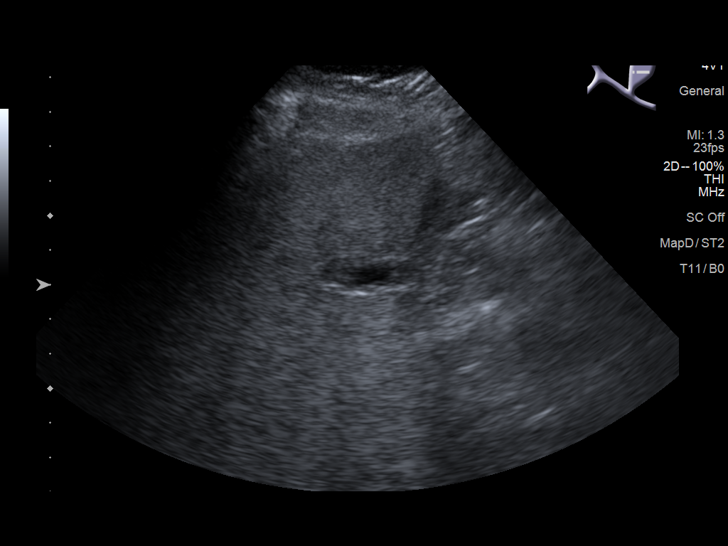
[im 14/83]
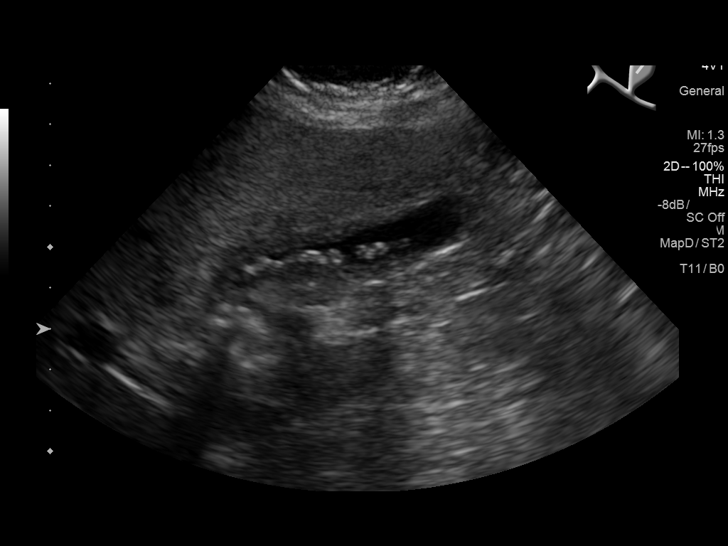
[im 21/83]
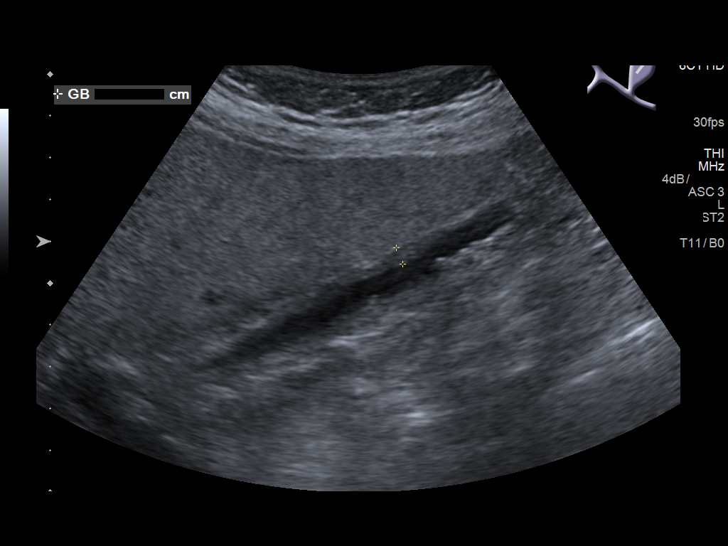
[im 28/83]
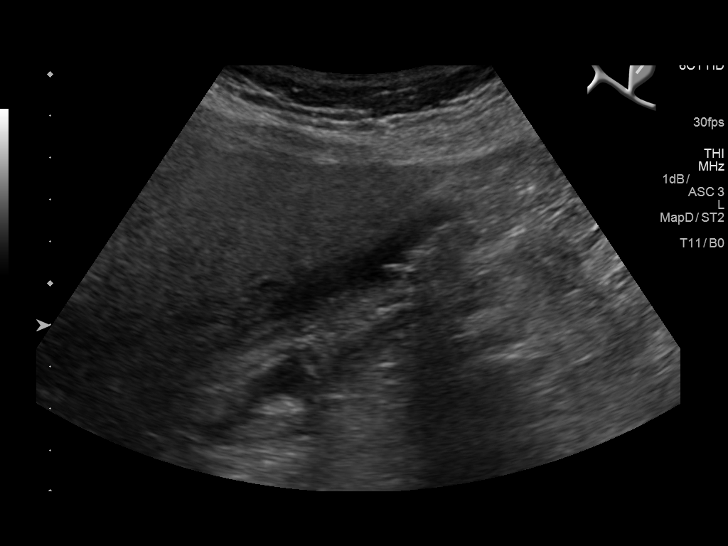
[im 31/83]
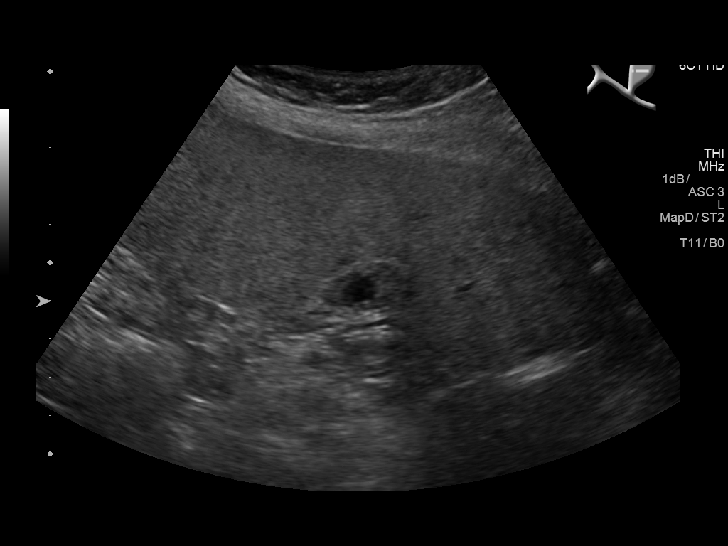
[im 38/83]
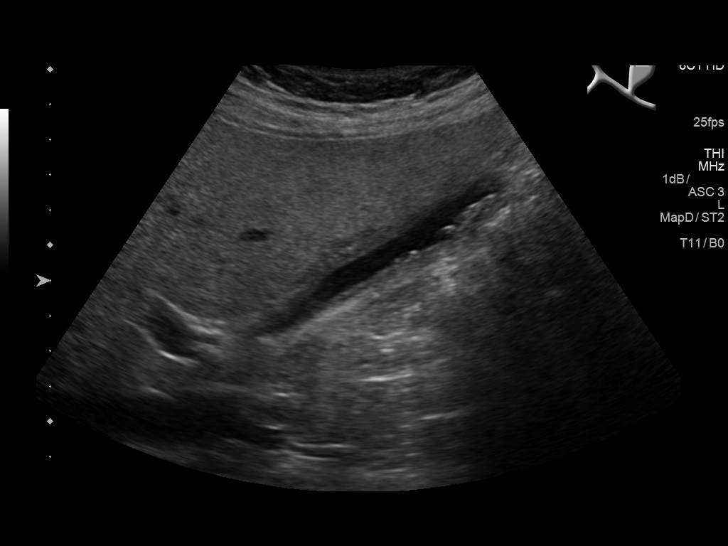
[im 45/83]
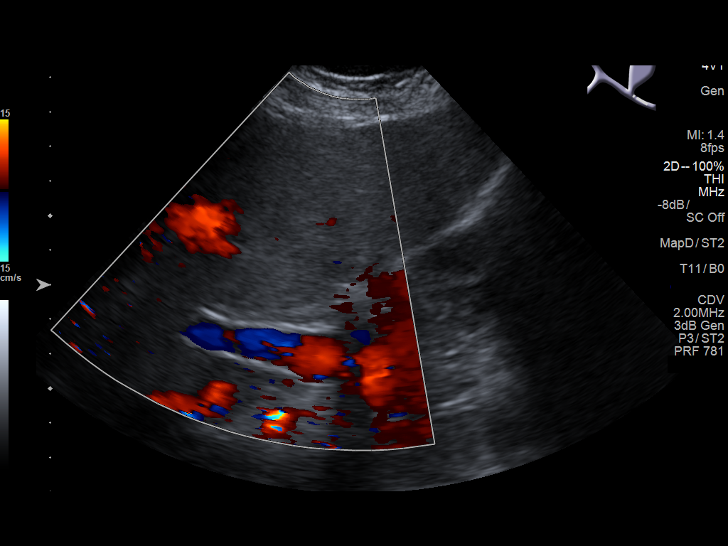
[im 52/83]
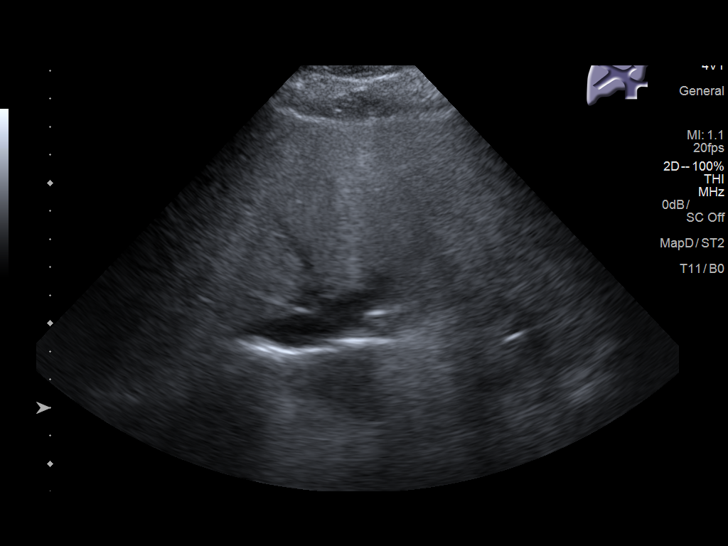
[im 55/83]
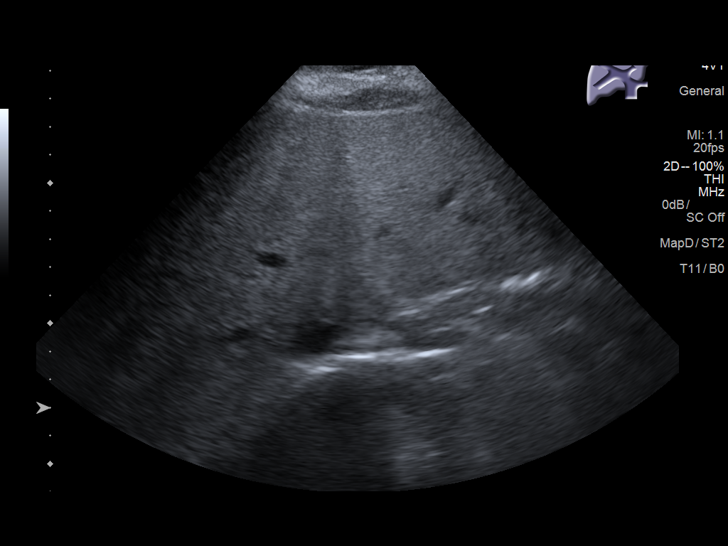
[im 62/83]
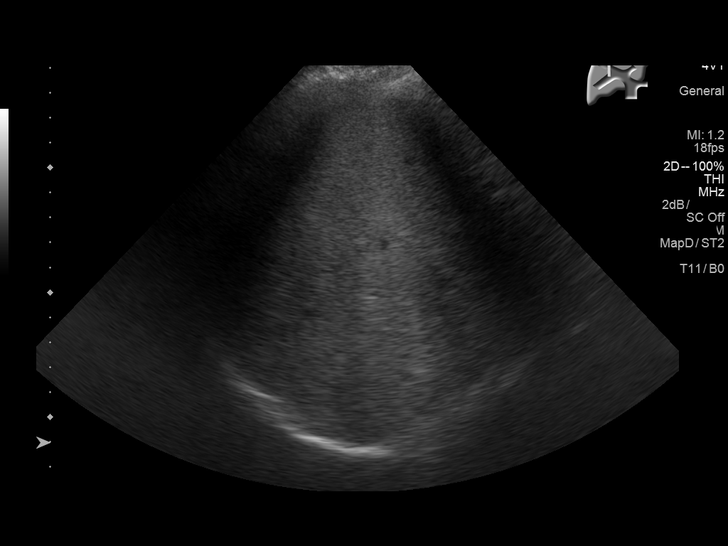
[im 69/83]
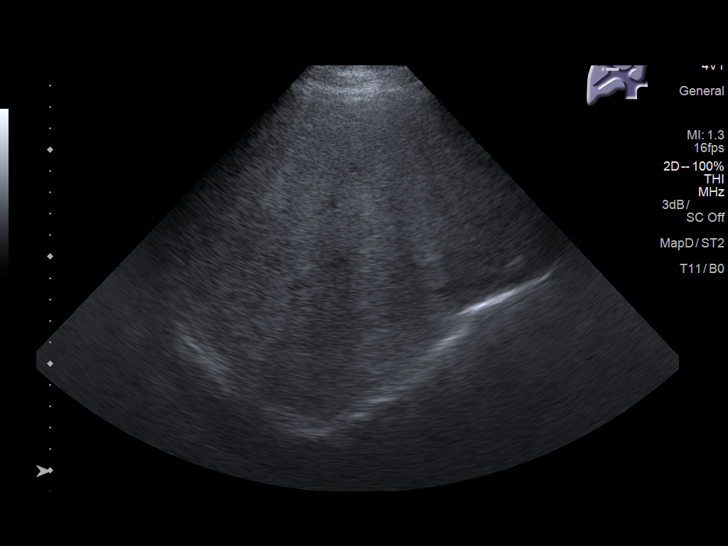
[im 76/83]
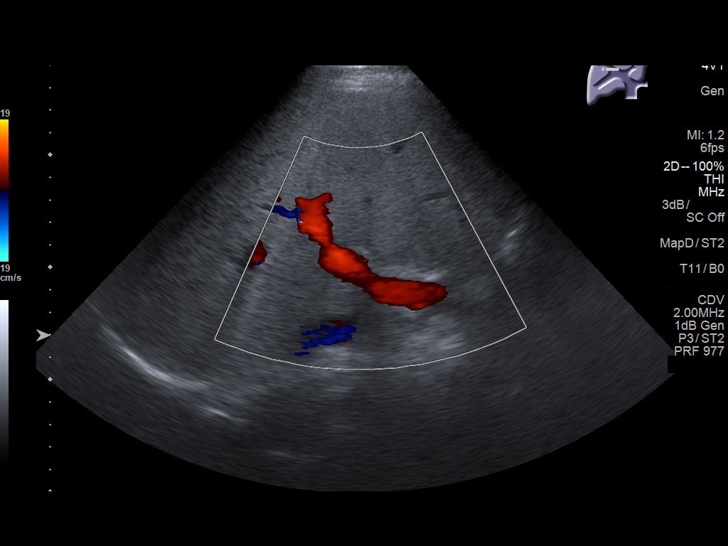
[im 83/83]
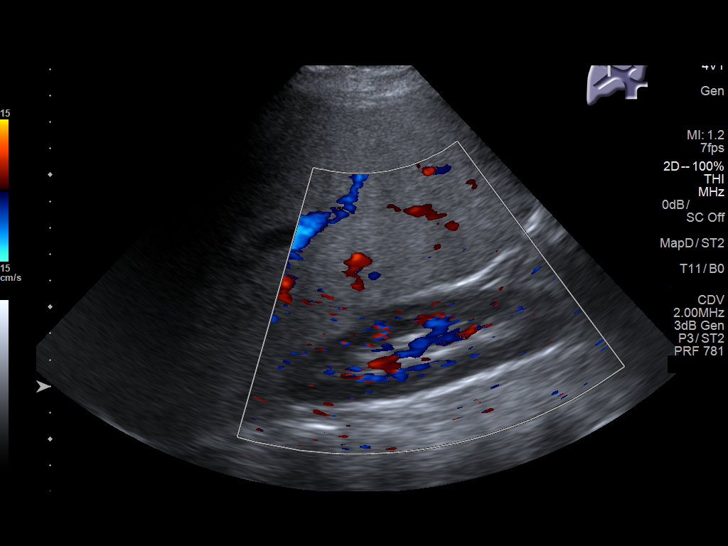

[14 of 25 positions shown; findings below may reference images not displayed]

FINDINGS: Gallbladder:

Multiple echogenic foci with posterior acoustic shadowing,
compatible with gallstones. Gallbladder is partially contracted. No
pericholecystic fluid. Gallbladder wall thickness is mildly
increased at 4 mm, likely related to under distention. Per report
from the sonographer, there was no sonographic Murphy's sign on
examination.

Common bile duct:

Diameter: 2.5 mm

Liver:

No focal lesion identified. Within normal limits in parenchymal
echogenicity. Normal hepatopetal flow in the portal vein.
IMPRESSION: 1. Study is again positive for cholelithiasis, with no definite
findings to suggest an acute cholecystitis at this time.

## 2017-03-29 IMAGING — XA DG CHOLANGIOGRAM OPERATIVE
2 series · 6 of 6 positions shown · non-contrast
Comparison: Ultrasound 03/21/2016

CLINICAL DATA: Right upper abdominal pain, cholelithiasis

EXAM:
INTRAOPERATIVE CHOLANGIOGRAM
TECHNIQUE: Cholangiographic images from the C-arm fluoroscopic device were
submitted for interpretation post-operatively. Please see the
procedural report for the amount of contrast and the fluoroscopy
time utilized.

[Series 7: cont. · 2 of 2 frames shown (1 of 2)]
[frame 1/2]
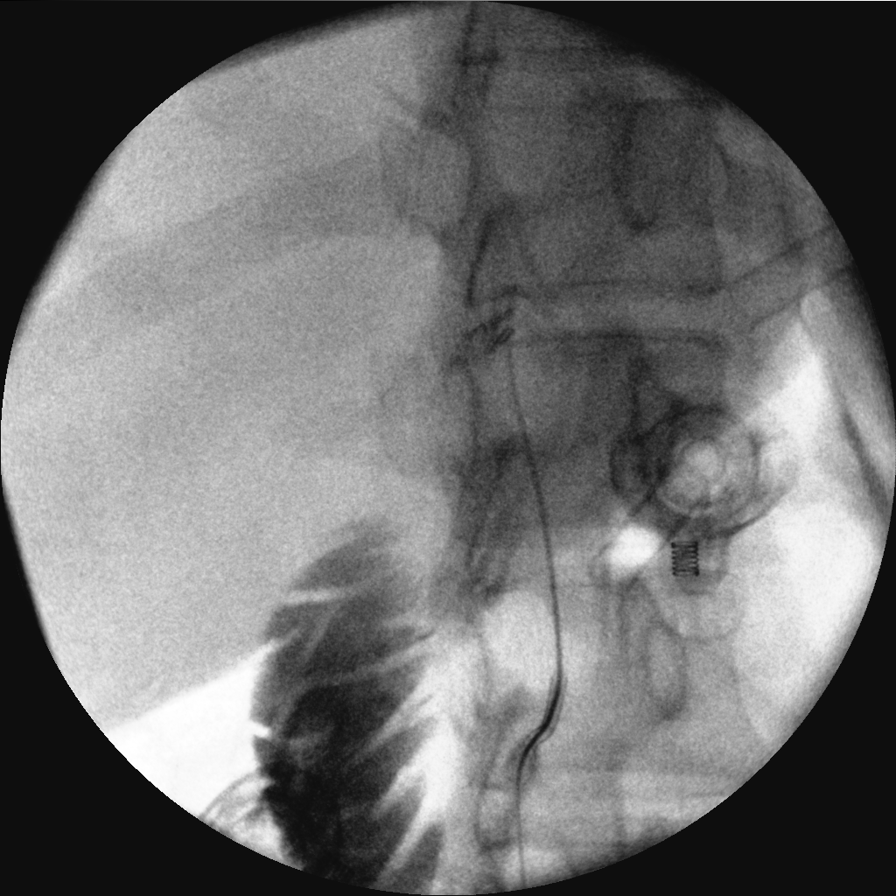
[frame 2/2]
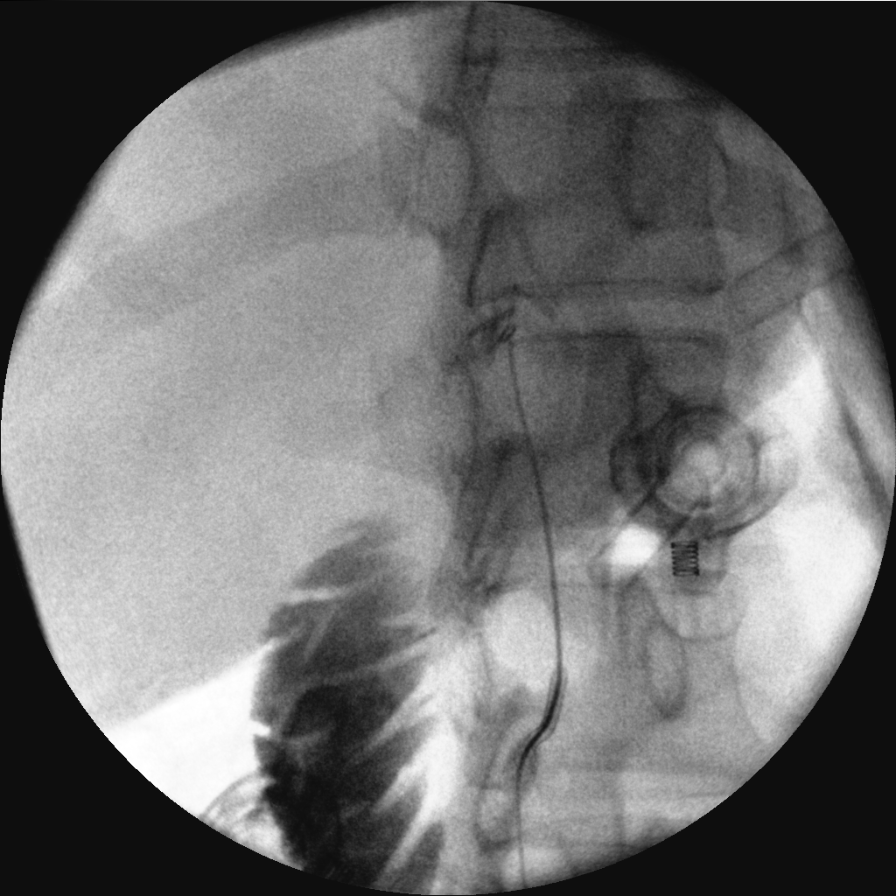

[Series 8: cont. · 4 of 80 frames shown (2 of 2)]
[frame 13/80]
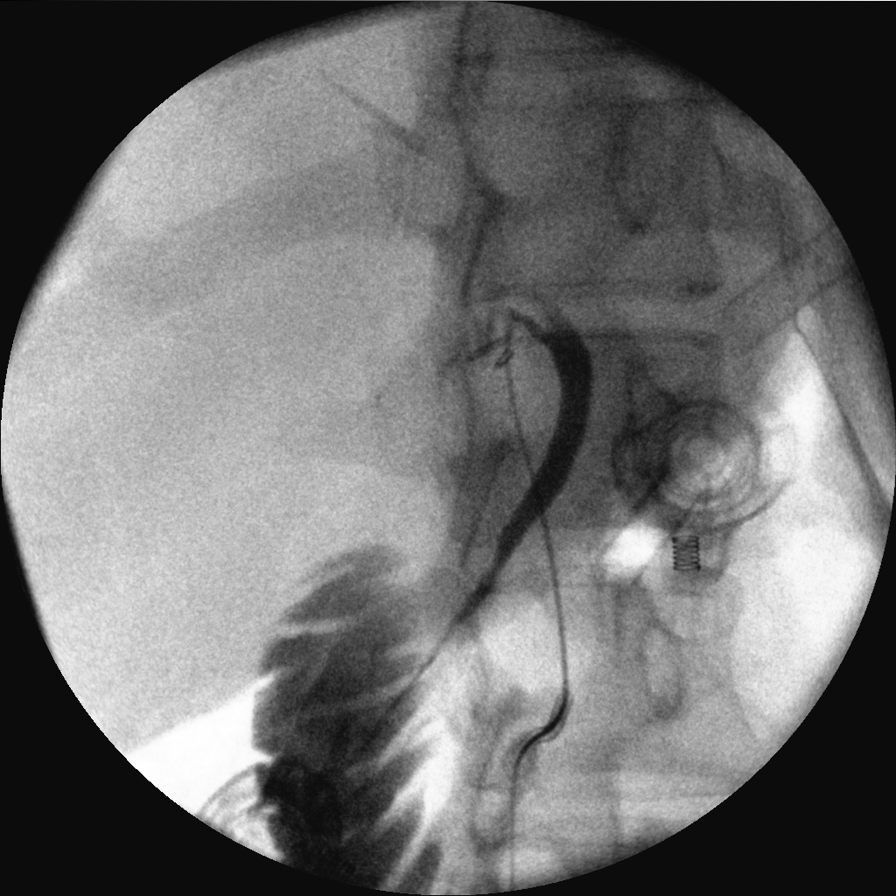
[frame 16/80]
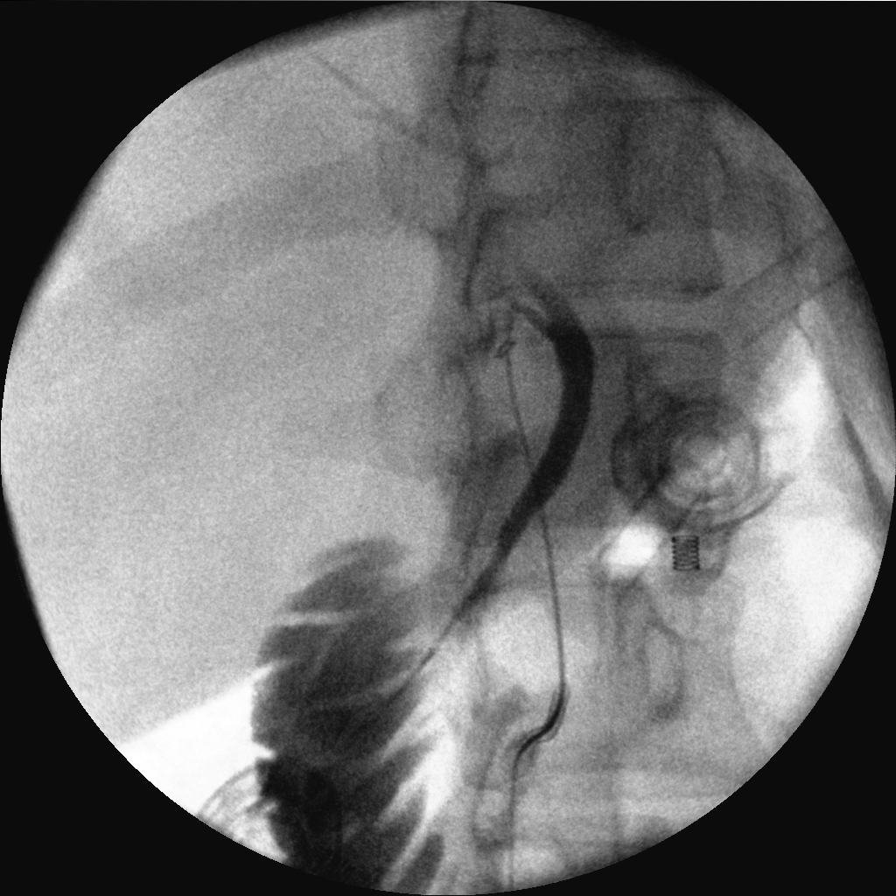
[frame 41/80]
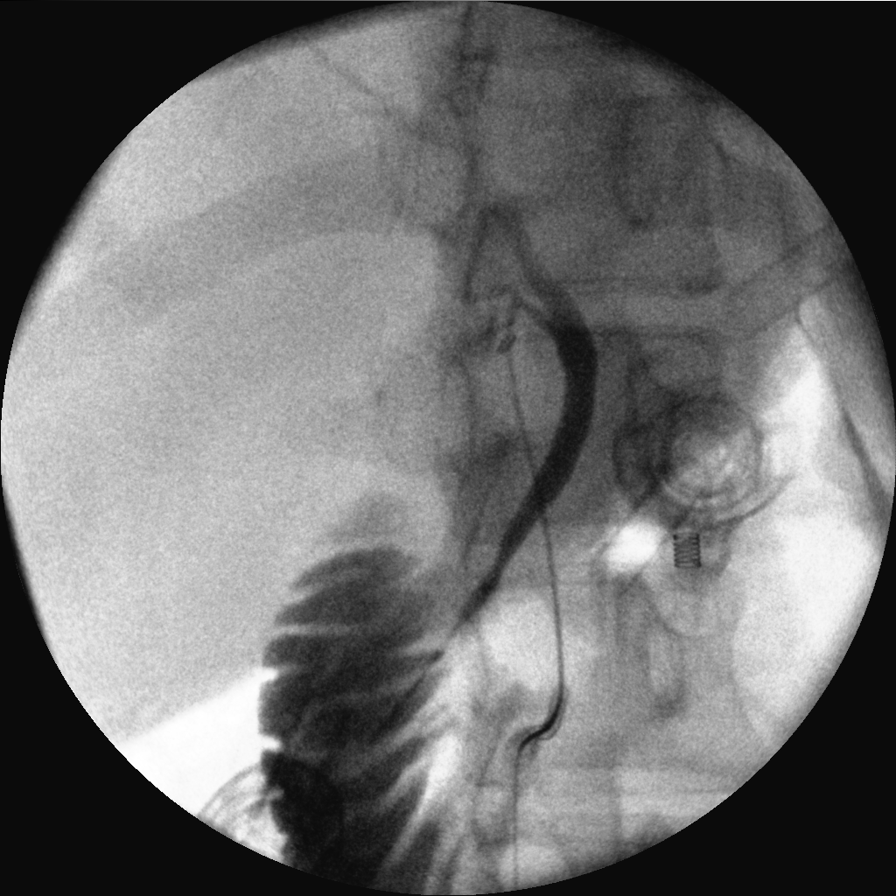
[frame 69/80]
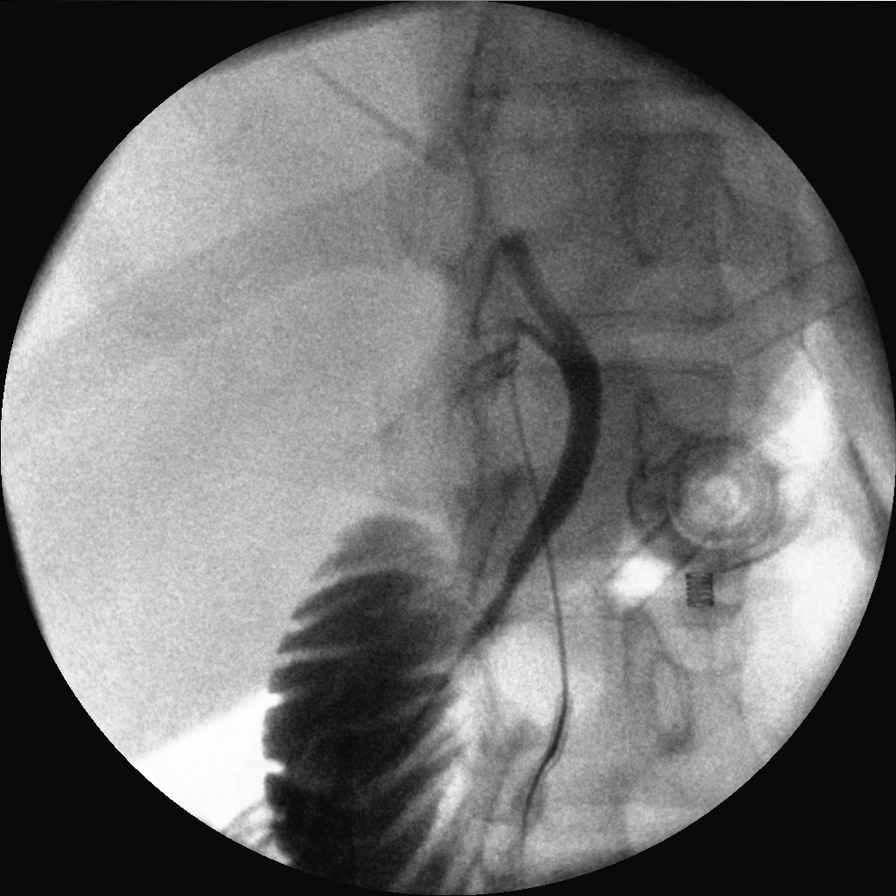

[6 of 6 positions shown; findings below may reference images not displayed]

FINDINGS: No persistent filling defects in the common duct. Intrahepatic ducts
are incompletely visualized, appearing decompressed centrally.
Contrast passes into the duodenum.

:
Negative for retained common duct stone.

## 2017-07-26 ENCOUNTER — Encounter: Payer: Medicaid Other | Admitting: Obstetrics and Gynecology

## 2017-11-29 ENCOUNTER — Encounter: Payer: Self-pay | Admitting: Certified Nurse Midwife

## 2017-11-29 ENCOUNTER — Encounter: Payer: Medicaid Other | Admitting: Obstetrics and Gynecology

## 2017-11-29 ENCOUNTER — Ambulatory Visit (INDEPENDENT_AMBULATORY_CARE_PROVIDER_SITE_OTHER): Payer: 59 | Admitting: Certified Nurse Midwife

## 2017-11-29 VITALS — BP 116/71 | HR 76 | Ht 66.0 in | Wt 149.0 lb

## 2017-11-29 DIAGNOSIS — Z3009 Encounter for other general counseling and advice on contraception: Secondary | ICD-10-CM

## 2017-11-29 DIAGNOSIS — Z113 Encounter for screening for infections with a predominantly sexual mode of transmission: Secondary | ICD-10-CM | POA: Diagnosis not present

## 2017-11-29 DIAGNOSIS — N930 Postcoital and contact bleeding: Secondary | ICD-10-CM

## 2017-11-29 DIAGNOSIS — Z01419 Encounter for gynecological examination (general) (routine) without abnormal findings: Secondary | ICD-10-CM | POA: Diagnosis not present

## 2017-11-29 MED ORDER — NORGESTIMATE-ETH ESTRADIOL 0.25-35 MG-MCG PO TABS: 1.0000 | ORAL_TABLET | Freq: Every day | ORAL | 11 refills | Status: AC

## 2017-11-29 NOTE — Patient Instructions (Signed)
Safe Sex Practicing safe sex means taking steps before and during sex to reduce your risk of:  Getting an STD (sexually transmitted disease).  Giving your partner an STD.  Unwanted pregnancy.  How can I practice safe sex?  To practice safe sex:  Limit your sexual partners to only one partner who is having sex with only you.  Avoid using alcohol and recreational drugs before having sex. These substances can affect your judgment.  Before having sex with a new partner: ? Talk to your partner about past partners, past STDs, and drug use. ? You and your partner should be screened for STDs and discuss the results with each other.  Check your body regularly for sores, blisters, rashes, or unusual discharge. If you notice any of these problems, visit your health care provider.  If you have symptoms of an infection or you are being treated for an STD, avoid sexual contact.  While having sex, use a condom. Make sure to: ? Use a condom every time you have vaginal, oral, or anal sex. Both females and males should wear condoms during oral sex. ? Keep condoms in place from the beginning to the end of sexual activity. ? Use a latex condom, if possible. Latex condoms offer the best protection. ? Use only water-based lubricants or oils to lubricate a condom. Using petroleum-based lubricants or oils will weaken the condom and increase the chance that it will break.  See your health care provider for regular screenings, exams, and tests for STDs.  Talk with your health care provider about the form of birth control (contraception) that is best for you.  Get vaccinated against hepatitis B and human papillomavirus (HPV).  If you are at risk of being infected with HIV (human immunodeficiency virus), talk with your health care provider about taking a prescription medicine to prevent HIV infection. You are considered at risk for HIV if: ? You are a man who has sex with other men. ? You are a  heterosexual man or woman who is sexually active with more than one partner. ? You take drugs by injection. ? You are sexually active with a partner who has HIV.  This information is not intended to replace advice given to you by your health care provider. Make sure you discuss any questions you have with your health care provider. Document Released: 07/13/2004 Document Revised: 10/20/2015 Document Reviewed: 04/25/2015 Elsevier Interactive Patient Education  2018 [employer]. Ethinyl Estradiol; Norgestimate tablets What is this medicine? ETHINYL ESTRADIOL; NORGESTIMATE (ETH in il es tra DYE ole; nor JES ti mate) is an oral contraceptive. The products combine two types of female hormones, an estrogen and a progestin. They are used to prevent ovulation and pregnancy. Some products are also used to treat acne in females. This medicine may be used for other purposes; ask your health care provider or pharmacist if you have questions. COMMON BRAND NAME(S): Estarylla, MONO-LINYAH, MonoNessa, Norgestimate/Ethinyl Estradiol, Ortho Tri-Cyclen, Ortho Tri-Cyclen Lo, Ortho-Cyclen, Previfem, Sprintec, Tri-Estarylla, TRI-LINYAH, Tri-Lo-Estarylla, Tri-Lo-Marzia, Tri-Lo-Sprintec, Tri-Previfem, Tri-Sprintec, Tri-VyLibra, Trinessa, Minerva Areola What should I tell my health care provider before I take this medicine? They need to know if you have or ever had any of these conditions: -abnormal vaginal bleeding -blood vessel disease or blood clots -breast, cervical, endometrial, ovarian, liver, or uterine cancer -diabetes -gallbladder disease -heart disease or recent heart attack -high blood pressure -high cholesterol -kidney disease -liver disease -migraine headaches -stroke -systemic lupus erythematosus (SLE) -tobacco smoker -an unusual or allergic reaction to  estrogens, progestins, other medicines, foods, dyes, or preservatives -pregnant or trying to get pregnant -breast-feeding How should I use  this medicine? Take this medicine by mouth. To reduce nausea, this medicine may be taken with food. Follow the directions on the prescription label. Take this medicine at the same time each day and in the order directed on the package. Do not take your medicine more often than directed. Contact your pediatrician regarding the use of this medicine in children. Special care may be needed. This medicine has been used in female children who have started having menstrual periods. A patient package insert for the product will be given with each prescription and refill. Read this sheet carefully each time. The sheet may change frequently. Overdosage: If you think you have taken too much of this medicine contact a poison control center or emergency room at once. NOTE: This medicine is only for you. Do not share this medicine with others. What if I miss a dose? If you miss a dose, refer to the patient information sheet you received with your medicine for direction. If you miss more than one pill, this medicine may not be as effective and you may need to use another form of birth control. What may interact with this medicine? Do not take this medicine with the following medication: -dasabuvir; ombitasvir; paritaprevir; ritonavir -ombitasvir; paritaprevir; ritonavir This medicine may also interact with the following medications: -acetaminophen -antibiotics or medicines for infections, especially rifampin, rifabutin, rifapentine, and griseofulvin, and possibly penicillins or tetracyclines -aprepitant -ascorbic acid (vitamin C) -atorvastatin -barbiturate medicines, such as phenobarbital -bosentan -carbamazepine -caffeine -clofibrate -cyclosporine -dantrolene -doxercalciferol -felbamate -grapefruit juice -hydrocortisone -medicines for anxiety or sleeping problems, such as diazepam or temazepam -medicines for diabetes, including pioglitazone -mineral  oil -modafinil -mycophenolate -nefazodone -oxcarbazepine -phenytoin -prednisolone -ritonavir or other medicines for HIV infection or AIDS -rosuvastatin -selegiline -soy isoflavones supplements -St. John's wort -tamoxifen or raloxifene -theophylline -thyroid hormones -topiramate -warfarin This list may not describe all possible interactions. Give your health care provider a list of all the medicines, herbs, non-prescription drugs, or dietary supplements you use. Also tell them if you smoke, drink alcohol, or use illegal drugs. Some items may interact with your medicine. What should I watch for while using this medicine? Visit your doctor or health care professional for regular checks on your progress. You will need a regular breast and pelvic exam and Pap smear while on this medicine. You should also discuss the need for regular mammograms with your health care professional, and follow his or her guidelines for these tests. This medicine can make your body retain fluid, making your fingers, hands, or ankles swell. Your blood pressure can go up. Contact your doctor or health care professional if you feel you are retaining fluid. Use an additional method of contraception during the first cycle that you take these tablets. If you have any reason to think you are pregnant, stop taking this medicine right away and contact your doctor or health care professional. If you are taking this medicine for hormone related problems, it may take several cycles of use to see improvement in your condition. Do not use this product if you smoke and are over 38 years of age. Smoking increases the risk of getting a blood clot or having a stroke while you are taking birth control pills, especially if you are more than 23 years old. If you are a smoker who is 32 years of age or younger, you are strongly advised not to smoke while  taking birth control pills. This medicine can make you more sensitive to the sun. Keep  out of the sun. If you cannot avoid being in the sun, wear protective clothing and use sunscreen. Do not use sun lamps or tanning beds/booths. If you wear contact lenses and notice visual changes, or if the lenses begin to feel uncomfortable, consult your eye care specialist. In some women, tenderness, swelling, or minor bleeding of the gums may occur. Notify your dentist if this happens. Brushing and flossing your teeth regularly may help limit this. See your dentist regularly and inform your dentist of the medicines you are taking. If you are going to have elective surgery, you may need to stop taking this medicine before the surgery. Consult your health care professional for advice. This medicine does not protect you against HIV infection (AIDS) or any other sexually transmitted diseases. What side effects may I notice from receiving this medicine? Side effects that you should report to your doctor or health care professional as soon as possible: -breast tissue changes or discharge -changes in vaginal bleeding during your period or between your periods -chest pain -coughing up blood -dizziness or fainting spells -headaches or migraines -leg, arm or groin pain -severe or sudden headaches -stomach pain (severe) -sudden shortness of breath -sudden loss of coordination, especially on one side of the body -speech problems -symptoms of vaginal infection like itching, irritation or unusual discharge -tenderness in the upper abdomen -vomiting -weakness or numbness in the arms or legs, especially on one side of the body -yellowing of the eyes or skin Side effects that usually do not require medical attention (report to your doctor or health care professional if they continue or are bothersome): -breakthrough bleeding and spotting that continues beyond the 3 initial cycles of pills -breast tenderness -mood changes, anxiety, depression, frustration, anger, or emotional outbursts -increased  sensitivity to sun or ultraviolet light -nausea -skin rash, acne, or brown spots on the skin -weight gain (slight) This list may not describe all possible side effects. Call your doctor for medical advice about side effects. You may report side effects to FDA at 1-800-FDA-1088. Where should I keep my medicine? Keep out of the reach of children. Store at room temperature between 15 and 30 degrees C (59 and 86 degrees F). Throw away any unused medicine after the expiration date. NOTE: This sheet is a summary. It may not cover all possible information. If you have questions about this medicine, talk to your doctor, pharmacist, or health care provider.  2018 Elsevier/Gold Standard (2016-02-14 08:09:09) Preventive Care 18-39 Years, Female Preventive care refers to lifestyle choices and visits with your health care provider that can promote health and wellness. What does preventive care include?  A yearly physical exam. This is also called an annual well check.  Dental exams once or twice a year.  Routine eye exams. Ask your health care provider how often you should have your eyes checked.  Personal lifestyle choices, including: ? Daily care of your teeth and gums. ? Regular physical activity. ? Eating a healthy diet. ? Avoiding tobacco and drug use. ? Limiting alcohol use. ? Practicing safe sex. ? Taking vitamin and mineral supplements as recommended by your health care provider. What happens during an annual well check? The services and screenings done by your health care provider during your annual well check will depend on your age, overall health, lifestyle risk factors, and family history of disease. Counseling Your health care provider may ask you questions about  your:  Alcohol use.  Tobacco use.  Drug use.  Emotional well-being.  Home and relationship well-being.  Sexual activity.  Eating habits.  Work and work Statistician.  Method of birth control.  Menstrual  cycle.  Pregnancy history.  Screening You may have the following tests or measurements:  Height, weight, and BMI.  Diabetes screening. This is done by checking your blood sugar (glucose) after you have not eaten for a while (fasting).  Blood pressure.  Lipid and cholesterol levels. These may be checked every 5 years starting at age 69.  Skin check.  Hepatitis C blood test.  Hepatitis B blood test.  Sexually transmitted disease (STD) testing.  BRCA-related cancer screening. This may be done if you have a family history of breast, ovarian, tubal, or peritoneal cancers.  Pelvic exam and Pap test. This may be done every 3 years starting at age 2. Starting at age 50, this may be done every 5 years if you have a Pap test in combination with an HPV test.  Discuss your test results, treatment options, and if necessary, the need for more tests with your health care provider. Vaccines Your health care provider may recommend certain vaccines, such as:  Influenza vaccine. This is recommended every year.  Tetanus, diphtheria, and acellular pertussis (Tdap, Td) vaccine. You may need a Td booster every 10 years.  Varicella vaccine. You may need this if you have not been vaccinated.  HPV vaccine. If you are 43 or younger, you may need three doses over 6 months.  Measles, mumps, and rubella (MMR) vaccine. You may need at least one dose of MMR. You may also need a second dose.  Pneumococcal 13-valent conjugate (PCV13) vaccine. You may need this if you have certain conditions and were not previously vaccinated.  Pneumococcal polysaccharide (PPSV23) vaccine. You may need one or two doses if you smoke cigarettes or if you have certain conditions.  Meningococcal vaccine. One dose is recommended if you are age 37-21 years and a first-year college student living in a residence hall, or if you have one of several medical conditions. You may also need additional booster doses.  Hepatitis A  vaccine. You may need this if you have certain conditions or if you travel or work in places where you may be exposed to hepatitis A.  Hepatitis B vaccine. You may need this if you have certain conditions or if you travel or work in places where you may be exposed to hepatitis B.  Haemophilus influenzae type b (Hib) vaccine. You may need this if you have certain risk factors.  Talk to your health care provider about which screenings and vaccines you need and how often you need them. This information is not intended to replace advice given to you by your health care provider. Make sure you discuss any questions you have with your health care provider. Document Released: 08/01/2001 Document Revised: 02/23/2016 Document Reviewed: 04/06/2015 Elsevier Interactive Patient Education  [employer].

## 2017-11-29 NOTE — Progress Notes (Signed)
Pt is here for an annual exam.

## 2017-11-29 NOTE — Progress Notes (Signed)
ANNUAL PREVENTATIVE CARE GYN  ENCOUNTER NOTE  Subjective:       Raven Barker is a 22 y.o. 321P1001 female here for a routine annual gynecologic exam.  Current complaints: 1. Desires OCP as contraception 2. Needs STI screening-old partner was positive for "something like chlamydia per [facility]" 3. Reports post coital bleeding for the last two (2) months, approximately   Denies difficulty breathing or respiratory distress, chest pain, abdominal pain, excessive vaginal bleeding, dysuria, and leg pain or swelling.   Gynecologic History  Patient's last menstrual period was 11/19/2017 (exact date).  Period Duration (Days): Four (4) Period Pattern: Regular Menstrual Flow: Moderate Menstrual Control: Maxi pad Menstrual Control Change Freq (Hours): Three (3) to four (4) Dysmenorrhea: (!) Moderate Dysmenorrhea Symptoms: Cramping  Contraception: none   Last Pap: 06/2016. Results were: normal  Obstetric History  OB History  Gravida Para Term Preterm AB Living  1 1 1     1   SAB TAB Ectopic Multiple Live Births        0 1    # Outcome Date GA Lbr Len/2nd Weight Sex Delivery Anes PTL Lv  1 Term 01/29/16 [id number]  8 lb 5.3 oz (3.78 kg) M Vag-Spont None  LIV    Past Medical History:  Diagnosis Date  . Anemia   . History of anemia   . Medical history non-contributory     Past Surgical History:  Procedure Laterality Date  . CHOLECYSTECTOMY N/A 05/01/2016   Procedure: LAPAROSCOPIC CHOLECYSTECTOMY WITH INTRAOPERATIVE CHOLANGIOGRAM;  Surgeon: Henrene DodgeJose Piscoya, MD;  Location: [facility] ORS;  Service: General;  Laterality: N/A;    No Known Allergies  Social History   Socioeconomic History  . Marital status: Single    Spouse name: Not on file  . Number of children: Not on file  . Years of education: Not on file  . Highest education level: Not on file  Occupational History  . Not on file  Social Needs  . Financial resource strain: Not on file  . Food insecurity:    Worry: Not on file   Inability: Not on file  . Transportation needs:    Medical: Not on file    Non-medical: Not on file  Tobacco Use  . Smoking status: Never Smoker  . Smokeless tobacco: Never Used  Substance and Sexual Activity  . Alcohol use: No    Alcohol/week: 0.0 oz  . Drug use: No  . Sexual activity: Yes    Partners: Male    Birth control/protection: None  Lifestyle  . Physical activity:    Days per week: Not on file    Minutes per session: Not on file  . Stress: Not on file  Relationships  . Social connections:    Talks on phone: Not on file    Gets together: Not on file    Attends religious service: Not on file    Active member of club or organization: Not on file    Attends meetings of clubs or organizations: Not on file    Relationship status: Not on file  . Intimate partner violence:    Fear of current or ex partner: Not on file    Emotionally abused: Not on file    Physically abused: Not on file    Forced sexual activity: Not on file  Other Topics Concern  . Not on file  Social History Narrative  . Not on file    Family History  Problem Relation Age of Onset  . Hypertension Maternal Grandfather   .  Diabetes Paternal Grandmother   . Hypertension Paternal Grandmother     The following portions of the patient's history were reviewed and updated as appropriate: allergies, current medications, past family history, past medical history, past social history, past surgical history and problem list.  Review of Systems  ROS negative except as noted above. Information obtained from patient.    Objective:   BP 116/71   Pulse 76   Ht 5\' 6"  (1.676 m)   Wt 149 lb (67.6 kg)   LMP 11/19/2017 (Exact Date)   Breastfeeding? No   BMI 24.05 kg/m   CONSTITUTIONAL: Well-developed, well-nourished female in no acute distress.   PSYCHIATRIC: Normal mood and affect. Normal behavior. Normal judgment and thought content.  NEUROLGIC: Alert and oriented to person, place, and time. Normal  muscle tone coordination. No cranial nerve deficit noted.  HENT:  Normocephalic, atraumatic, External right and left ear normal.   EYES: Conjunctivae and EOM are normal. Pupils are equal and round.    NECK: Normal range of motion, supple, no masses.  Normal thyroid.   SKIN: Skin is warm and dry. No rash noted. Not diaphoretic. No erythema. No pallor.  CARDIOVASCULAR: Normal heart rate noted, regular rhythm, no murmur.  RESPIRATORY: Clear to auscultation bilaterally. Effort and breath sounds normal, no problems with respiration noted.  BREASTS: Symmetric in size. No masses, skin changes, nipple drainage, or lymphadenopathy.  ABDOMEN: Soft, normal bowel sounds, no distention noted.  No tenderness, rebound or guarding.   PELVIC:  External Genitalia: Normal  Vagina: Normal  Cervix: Friable, NuSwab collected  Uterus: Normal  Adnexa: Normal  MUSCULOSKELETAL: Normal range of motion. No tenderness.  No cyanosis, clubbing, or edema.  2+ distal pulses.  LYMPHATIC: No Axillary, Supraclavicular, or Inguinal Adenopathy.  Assessment:   Annual gynecologic examination 22 y.o.   Contraception: OCP (estrogen/progesterone)   Normal BMI   Problem List Items Addressed This Visit    None    Visit Diagnoses    Well woman exam    -  Primary   Relevant Orders   NuSwab Vaginitis Plus (VG+)   Routine screening for STI (sexually transmitted infection)       Relevant Orders   NuSwab Vaginitis Plus (VG+)   PCB (post coital bleeding)       Relevant Orders   NuSwab Vaginitis Plus (VG+)   Encounter for counseling regarding contraception          Plan:   Pap: Not needed  Labs: NuSwab, see orders. Will contact patient via MyChart with results  Routine preventative health maintenance measures emphasized: Exercise/Diet/Weight control, Tobacco Warnings, Alcohol/Substance use risks, Stress Management, Peer Pressure Issues and Safe Sex; see AVS  Rx: Sprintec, see orders  Reviewed red flag  symptoms and when to call  RTC x 1 year for Annual Exam or sooner if needed   Gunnar Bulla, CNM [facility], [facility]

## 2017-12-03 ENCOUNTER — Other Ambulatory Visit: Payer: Self-pay | Admitting: Certified Nurse Midwife

## 2017-12-03 LAB — NUSWAB VAGINITIS PLUS (VG+)
CHLAMYDIA TRACHOMATIS, NAA: POSITIVE — AB
Candida albicans, NAA: NEGATIVE
Candida glabrata, NAA: NEGATIVE
Neisseria gonorrhoeae, NAA: NEGATIVE
Trich vag by NAA: NEGATIVE

## 2017-12-03 MED ORDER — AZITHROMYCIN 500 MG PO TABS: 1000.0000 mg | ORAL_TABLET | Freq: Once | ORAL | 0 refills | Status: AC

## 2020-02-25 ENCOUNTER — Other Ambulatory Visit: Payer: Self-pay

## 2020-02-25 ENCOUNTER — Other Ambulatory Visit: Payer: Medicaid Other

## 2020-02-25 DIAGNOSIS — Z20822 Contact with and (suspected) exposure to covid-19: Secondary | ICD-10-CM

## 2020-02-27 LAB — SARS-COV-2, NAA 2 DAY TAT

## 2020-02-27 LAB — NOVEL CORONAVIRUS, NAA: SARS-CoV-2, NAA: DETECTED — AB

## 2020-02-28 ENCOUNTER — Telehealth (HOSPITAL_COMMUNITY): Payer: Self-pay | Admitting: Adult Health

## 2020-02-28 NOTE — Telephone Encounter (Signed)
Called and LMOM regarding monoclonal antibody treatment for COVID 19 given to those who are at risk for complications and/or hospitalization of the virus.  Patient meets criteria based on: increased social risk  Call back number given: [phone]  My chart message: sent  Lillard Anes, NP

## 2020-03-08 ENCOUNTER — Other Ambulatory Visit: Payer: Medicaid Other

## 2020-03-08 ENCOUNTER — Other Ambulatory Visit: Payer: Self-pay

## 2020-03-08 DIAGNOSIS — Z20822 Contact with and (suspected) exposure to covid-19: Secondary | ICD-10-CM

## 2020-03-09 LAB — NOVEL CORONAVIRUS, NAA: SARS-CoV-2, NAA: NOT DETECTED

## 2020-03-09 LAB — SARS-COV-2, NAA 2 DAY TAT
